# Patient Record
Sex: Male | Born: 1945 | Race: Black or African American | Hispanic: No | Marital: Single | Smoking: Former smoker
Health system: Southern US, Community
[De-identification: ages and names within clinical notes are randomized; demographics above are authoritative.]

## PROBLEM LIST (undated history)

## (undated) DIAGNOSIS — T7840XA Allergy, unspecified, initial encounter: Secondary | ICD-10-CM

## (undated) DIAGNOSIS — N529 Male erectile dysfunction, unspecified: Secondary | ICD-10-CM

## (undated) DIAGNOSIS — I10 Essential (primary) hypertension: Secondary | ICD-10-CM

## (undated) HISTORY — DX: Allergy, unspecified, initial encounter: T78.40XA

## (undated) HISTORY — PX: COLONOSCOPY: SHX174

## (undated) HISTORY — DX: Essential (primary) hypertension: I10

## (undated) HISTORY — DX: Male erectile dysfunction, unspecified: N52.9

---

## 2004-07-19 ENCOUNTER — Ambulatory Visit: Payer: Self-pay | Admitting: Family Medicine

## 2004-11-26 ENCOUNTER — Ambulatory Visit: Payer: Self-pay | Admitting: Family Medicine

## 2005-04-26 ENCOUNTER — Encounter (INDEPENDENT_AMBULATORY_CARE_PROVIDER_SITE_OTHER): Payer: Self-pay | Admitting: *Deleted

## 2005-04-26 LAB — CONVERTED CEMR LAB: PSA: 1.27 ng/mL

## 2005-05-14 ENCOUNTER — Ambulatory Visit: Payer: Self-pay | Admitting: Family Medicine

## 2005-12-05 ENCOUNTER — Encounter (INDEPENDENT_AMBULATORY_CARE_PROVIDER_SITE_OTHER): Payer: Self-pay | Admitting: *Deleted

## 2005-12-05 ENCOUNTER — Ambulatory Visit: Payer: Self-pay | Admitting: Family Medicine

## 2005-12-05 LAB — CONVERTED CEMR LAB: PSA: 1.07 ng/mL

## 2006-04-08 ENCOUNTER — Ambulatory Visit: Payer: Self-pay | Admitting: Family Medicine

## 2006-05-21 ENCOUNTER — Ambulatory Visit (HOSPITAL_COMMUNITY): Admission: RE | Admit: 2006-05-21 | Discharge: 2006-05-21 | Payer: Self-pay | Admitting: General Surgery

## 2006-06-12 ENCOUNTER — Ambulatory Visit: Payer: Self-pay | Admitting: Family Medicine

## 2006-10-13 ENCOUNTER — Ambulatory Visit: Payer: Self-pay | Admitting: Family Medicine

## 2007-01-19 ENCOUNTER — Encounter (INDEPENDENT_AMBULATORY_CARE_PROVIDER_SITE_OTHER): Payer: Self-pay | Admitting: *Deleted

## 2007-01-19 ENCOUNTER — Ambulatory Visit: Payer: Self-pay | Admitting: Family Medicine

## 2007-01-19 LAB — CONVERTED CEMR LAB: PSA: 1.16 ng/mL

## 2007-01-20 ENCOUNTER — Encounter: Payer: Self-pay | Admitting: Family Medicine

## 2007-01-20 LAB — CONVERTED CEMR LAB
Calcium: 8.9 mg/dL (ref 8.4–10.5)
Cholesterol: 152 mg/dL (ref 0–200)
Creatinine, Ser: 0.98 mg/dL (ref 0.40–1.50)
Eosinophils Absolute: 0.2 10*3/uL (ref 0.0–0.7)
Eosinophils Relative: 4 % (ref 0–5)
Glucose, Bld: 87 mg/dL (ref 70–99)
HCT: 39.6 % (ref 39.0–52.0)
Lymphs Abs: 1.3 10*3/uL (ref 0.7–3.3)
MCV: 93.8 fL (ref 78.0–100.0)
Monocytes Absolute: 0.5 10*3/uL (ref 0.2–0.7)
Platelets: 180 10*3/uL (ref 150–400)
RDW: 13.2 % (ref 11.5–14.0)
Sodium: 135 meq/L (ref 135–145)
Total CHOL/HDL Ratio: 2.6
WBC: 5.6 10*3/uL (ref 4.0–10.5)

## 2007-06-03 ENCOUNTER — Ambulatory Visit: Payer: Self-pay | Admitting: Family Medicine

## 2007-07-20 ENCOUNTER — Encounter (INDEPENDENT_AMBULATORY_CARE_PROVIDER_SITE_OTHER): Payer: Self-pay | Admitting: *Deleted

## 2007-11-18 ENCOUNTER — Ambulatory Visit: Payer: Self-pay | Admitting: Family Medicine

## 2007-11-18 DIAGNOSIS — F528 Other sexual dysfunction not due to a substance or known physiological condition: Secondary | ICD-10-CM | POA: Insufficient documentation

## 2007-11-18 DIAGNOSIS — I10 Essential (primary) hypertension: Secondary | ICD-10-CM | POA: Insufficient documentation

## 2007-11-18 DIAGNOSIS — J309 Allergic rhinitis, unspecified: Secondary | ICD-10-CM | POA: Insufficient documentation

## 2008-01-20 ENCOUNTER — Encounter: Payer: Self-pay | Admitting: Family Medicine

## 2008-01-20 LAB — CONVERTED CEMR LAB
BUN: 19 mg/dL (ref 6–23)
Chloride: 104 meq/L (ref 96–112)
Glucose, Bld: 98 mg/dL (ref 70–99)
LDL Cholesterol: 107 mg/dL — ABNORMAL HIGH (ref 0–99)
Lymphocytes Relative: 31 % (ref 12–46)
Lymphs Abs: 1.8 10*3/uL (ref 0.7–4.0)
MCV: 91.5 fL (ref 78.0–100.0)
Monocytes Relative: 7 % (ref 3–12)
Neutro Abs: 3.4 10*3/uL (ref 1.7–7.7)
Neutrophils Relative %: 58 % (ref 43–77)
Potassium: 4.6 meq/L (ref 3.5–5.3)
RBC: 4.35 M/uL (ref 4.22–5.81)
Sodium: 140 meq/L (ref 135–145)
Total CHOL/HDL Ratio: 3.2
VLDL: 16 mg/dL (ref 0–40)
WBC: 5.8 10*3/uL (ref 4.0–10.5)

## 2008-05-10 ENCOUNTER — Ambulatory Visit: Payer: Self-pay | Admitting: Family Medicine

## 2008-10-26 ENCOUNTER — Ambulatory Visit: Payer: Self-pay | Admitting: Family Medicine

## 2008-10-31 DIAGNOSIS — E663 Overweight: Secondary | ICD-10-CM | POA: Insufficient documentation

## 2009-01-03 ENCOUNTER — Telehealth: Payer: Self-pay | Admitting: Family Medicine

## 2009-01-09 ENCOUNTER — Encounter: Payer: Self-pay | Admitting: Family Medicine

## 2009-01-09 LAB — CONVERTED CEMR LAB
Basophils Relative: 0 % (ref 0–1)
CO2: 23 meq/L (ref 19–32)
Calcium: 9.5 mg/dL (ref 8.4–10.5)
Creatinine, Ser: 1.05 mg/dL (ref 0.40–1.50)
Eosinophils Absolute: 0.2 10*3/uL (ref 0.0–0.7)
HDL: 59 mg/dL (ref >39)
Lymphs Abs: 2 10*3/uL (ref 0.7–4.0)
MCHC: 32.8 g/dL (ref 30.0–36.0)
Monocytes Relative: 9 % (ref 3–12)
Neutro Abs: 1.9 10*3/uL (ref 1.7–7.7)
Neutrophils Relative %: 42 % — ABNORMAL LOW (ref 43–77)
Platelets: 167 10*3/uL (ref 150–400)
RBC: 4.22 M/uL (ref 4.22–5.81)
Sodium: 138 meq/L (ref 135–145)
TSH: 1.253 microintl units/mL (ref 0.350–4.500)
Total CHOL/HDL Ratio: 2.9
Triglycerides: 74 mg/dL (ref <150)
WBC: 4.5 10*3/uL (ref 4.0–10.5)

## 2009-02-07 ENCOUNTER — Ambulatory Visit: Payer: Self-pay | Admitting: Family Medicine

## 2009-06-19 ENCOUNTER — Ambulatory Visit: Payer: Self-pay | Admitting: Family Medicine

## 2009-07-03 ENCOUNTER — Ambulatory Visit: Payer: Self-pay | Admitting: Family Medicine

## 2010-01-23 ENCOUNTER — Ambulatory Visit: Payer: Self-pay | Admitting: Family Medicine

## 2010-01-23 DIAGNOSIS — R5381 Other malaise: Secondary | ICD-10-CM

## 2010-01-23 DIAGNOSIS — R5383 Other fatigue: Secondary | ICD-10-CM

## 2010-01-24 LAB — CONVERTED CEMR LAB
BUN: 12 mg/dL (ref 6–23)
Basophils Relative: 0 % (ref 0–1)
CO2: 26 meq/L (ref 19–32)
Calcium: 9.6 mg/dL (ref 8.4–10.5)
Chloride: 100 meq/L (ref 96–112)
Creatinine, Ser: 1.13 mg/dL (ref 0.40–1.50)
Eosinophils Absolute: 0.2 10*3/uL (ref 0.0–0.7)
Eosinophils Relative: 5 % (ref 0–5)
Glucose, Bld: 92 mg/dL (ref 70–99)
HCT: 40.5 % (ref 39.0–52.0)
HDL: 53 mg/dL (ref >39)
Hemoglobin: 13.1 g/dL (ref 13.0–17.0)
LDL Cholesterol: 89 mg/dL (ref 0–99)
Lymphs Abs: 1.7 10*3/uL (ref 0.7–4.0)
MCHC: 32.3 g/dL (ref 30.0–36.0)
MCV: 91.4 fL (ref 78.0–100.0)
Monocytes Absolute: 0.4 10*3/uL (ref 0.1–1.0)
Monocytes Relative: 8 % (ref 3–12)
PSA: 1.21 ng/mL (ref 0.10–4.00)
RBC: 4.43 M/uL (ref 4.22–5.81)
TSH: 1.177 microintl units/mL (ref 0.350–4.500)
WBC: 4.6 10*3/uL (ref 4.0–10.5)

## 2010-06-26 ENCOUNTER — Ambulatory Visit: Payer: Self-pay | Admitting: Family Medicine

## 2010-08-23 ENCOUNTER — Encounter: Payer: Self-pay | Admitting: Family Medicine

## 2010-08-27 ENCOUNTER — Encounter: Payer: Self-pay | Admitting: Family Medicine

## 2010-09-04 NOTE — Assessment & Plan Note (Signed)
Summary: OV   Vital Signs:  Patient profile:   65 year old male Height:      67 inches Weight:      167 pounds BMI:     26.25 O2 Sat:      97 % Pulse rate:   89 / minute Pulse rhythm:   regular Resp:     16 per minute BP sitting:   120 / 60  (left arm) Cuff size:   regular  Vitals Entered By: Everitt Amber LPN (January 23, 2010 10:00 AM)  Nutrition Counseling: Patient's BMI is greater than 25 and therefore counseled on weight management options. CC: Follow up chronic problems   CC:  Follow up chronic problems.  History of Present Illness: Reports  that he has been doing well. He has changed his diet and also remains active, with excellent weight loss. Denies recent fever or chills. Denies sinus pressure, nasal congestion , ear pain or sore throat. Denies chest congestion, or cough productive of sputum. Denies chest pain, palpitations, PND, orthopnea or leg swelling. Denies abdominal pain, nausea, vomitting, diarrhea or constipation. Denies change in bowel movements or bloody stool. Denies dysuria , frequency, incontinence or hesitancy. Denies  joint pain, swelling, or reduced mobility. Denies headaches, vertigo, seizures. Denies depression, anxiety or insomnia. Denies  rash, lesions, or itch.     Current Medications (verified): 1)  Doxazosin Mesylate 4 Mg  Tabs (Doxazosin Mesylate) .... One Tab By Mouth Once Daily 2)  Lisinopril 5 Mg  Tabs (Lisinopril) .... One Tab By Mouth Once Daily 3)  Aspirin 81 Mg  Tbec (Aspirin) .... One Tab By Mouth Once Daily  Allergies (verified): No Known Drug Allergies  Review of Systems      See HPI General:  Complains of fatigue. Eyes:  Denies discharge, eye pain, and red eye. Heme:  Denies abnormal bruising and bleeding. Allergy:  Denies hives or rash and itching eyes.  Physical Exam  General:  Well-developed,well-nourished,in no acute distress; alert,appropriate and cooperative throughout examination HEENT: No facial asymmetry,    EOMI, No sinus tenderness, TM's Clear, oropharynx  pink and moist.   Chest: Clear to auscultation bilaterally.  CVS: S1, S2, No murmurs, No S3.   Abd: Soft, Nontender.  MS: Adequate ROM spine, hips, shoulders and knees.  Ext: No edema.   CNS: CN 2-12 intact, power tone and sensation normal throughout.   Skin: Intact, no visible lesions or rashes.  Psych: Good eye contact, normal affect.  Memory intact, not anxious or depressed appearing.    Impression & Recommendations:  Problem # 1:  FATIGUE (ICD-780.79) Assessment Comment Only  Orders: T-CBC w/Diff ([id number]) T-TSH ([id number])  Problem # 2:  OVERWEIGHT (ICD-278.02) Assessment: Improved  Ht: 67 (01/23/2010)   Wt: 167 (01/23/2010)   BMI: 26.25 (01/23/2010)  Problem # 3:  HYPERTENSION (ICD-401.9) Assessment: Unchanged  His updated medication list for this problem includes:    Doxazosin Mesylate 4 Mg Tabs (Doxazosin mesylate) ..... One tab by mouth once daily    Lisinopril 5 Mg Tabs (Lisinopril) ..... One tab by mouth once daily  BP today: 120/60 Prior BP: 120/80 (07/03/2009)  Labs Reviewed: K+: 5.0 (01/09/2009) Creat: : 1.05 (01/09/2009)   Chol: 171 (01/09/2009)   HDL: 59 (01/09/2009)   LDL: 97 (01/09/2009)   TG: 74 (01/09/2009)  Complete Medication List: 1)  Doxazosin Mesylate 4 Mg Tabs (Doxazosin mesylate) .... One tab by mouth once daily 2)  Lisinopril 5 Mg Tabs (Lisinopril) .... One tab by mouth once daily 3)  Aspirin 81 Mg Tbec (Aspirin) .... One tab by mouth once daily  Other Orders: T-Basic Metabolic Panel [id number]) T-Lipid Profile [phone]) T-PSA [phone])  Patient Instructions: 1)  Please schedule a follow-up appointment in 4 months. 2)  It is important that you exercise regularly at least 20 minutes 5 times a week. If you develop chest pain, have severe difficulty breathing, or feel very tired , stop exercising immediately and seek medical attention. 3)  You need to lose weight.  Consider a lower calorie diet and regular exercise. Congrats on weight loss 9 pounds, keep it up. 4)  Pls continue to eat alot of vegetables and fruits 5)  BMP prior to visit, ICD-9: 6)  Lipid Panel prior to visit, ICD-9: 7)  TSH prior to visit, ICD-9:   fasting today 8)  CBC w/ Diff prior to visit, ICD-9: 9)  PSA prior to visit, ICD-9: 10)  No med changes Prescriptions: LISINOPRIL 5 MG  TABS (LISINOPRIL) one tab by mouth once daily  #90 x 3   Entered by:   Everitt Amber LPN   Authorized by:   Syliva Overman MD   Signed by:   Everitt Amber LPN on 24/40/1027   Method used:   Electronically to        [facility]  [address] (retail)       [address]       [place], [place]  [address]       Ph: [phone]       Fax: [phone]   RxID:   [id number] DOXAZOSIN MESYLATE 4 MG  TABS (DOXAZOSIN MESYLATE) one tab by mouth once daily  #90 x 3   Entered by:   Everitt Amber LPN   Authorized by:   Syliva Overman MD   Signed by:   Everitt Amber LPN on 08/13/3233   Method used:   Electronically to        [facility]  [address] (retail)       [address]       [place], [place]  [address]       Ph: [phone]       Fax: [phone]   RxID:   [id number]

## 2010-09-04 NOTE — Assessment & Plan Note (Signed)
Summary: f up   Vital Signs:  Patient profile:   65 year old male Height:      67 inches Weight:      180.25 pounds BMI:     28.33 O2 Sat:      85 % on Room air Pulse rate:   85 / minute Pulse rhythm:   regular Resp:     16 per minute BP sitting:   104 / 60  (left arm)  Vitals Entered By: Adella Hare LPN (June 26, 2010 11:11 AM)  Nutrition Counseling: Patient's BMI is greater than 25 and therefore counseled on weight management options.  O2 Flow:  Room air CC: follow-up visit Is Patient Diabetic? No Pain Assessment Patient in pain? no        CC:  follow-up visit.  History of Present Illness: Reports  thathe has been  doing well. Denies recent fever or chills. Denies sinus pressure, nasal congestion , ear pain or sore throat. Denies chest congestion, or cough productive of sputum. Denies chest pain, palpitations, PND, orthopnea or leg swelling. Denies abdominal pain, nausea, vomitting, diarrhea or constipation. Denies change in bowel movements or bloody stool. Denies dysuria , frequency, incontinence or hesitancy. Denies  joint pain, swelling, or reduced mobility. Denies headaches, vertigo, seizures. Denies depression, anxiety or insomnia. Denies  rash, lesions, or itch.     Current Medications (verified): 1)  Doxazosin Mesylate 4 Mg  Tabs (Doxazosin Mesylate) .... One Tab By Mouth Once Daily 2)  Lisinopril 5 Mg  Tabs (Lisinopril) .... One Tab By Mouth Once Daily 3)  Aspirin 81 Mg  Tbec (Aspirin) .... One Tab By Mouth Once Daily  Allergies (verified): No Known Drug Allergies  Review of Systems      See HPI Eyes:  Denies discharge, eye pain, and red eye. Endo:  Denies cold intolerance, excessive hunger, excessive thirst, and heat intolerance. Heme:  Denies abnormal bruising, bleeding, and enlarge lymph nodes. Allergy:  Denies hives or rash and itching eyes.  Physical Exam  General:  Well-developed,well-nourished,in no acute distress;  alert,appropriate and cooperative throughout examination HEENT: No facial asymmetry,  EOMI, No sinus tenderness, TM's Clear, oropharynx  pink and moist.   Chest: Clear to auscultation bilaterally.  CVS: S1, S2, No murmurs, No S3.   Abd: Soft, Nontender.  MS: Adequate ROM spine, hips, shoulders and knees.  Ext: No edema.   CNS: CN 2-12 intact, power tone and sensation normal throughout.   Skin: Intact, no visible lesions or rashes.  Psych: Good eye contact, normal affect.  Memory intact, not anxious or depressed appearing.    Impression & Recommendations:  Problem # 1:  HYPERTENSION (ICD-401.9) Assessment Unchanged  His updated medication list for this problem includes:    Doxazosin Mesylate 4 Mg Tabs (Doxazosin mesylate) ..... One tab by mouth once daily    Lisinopril 5 Mg Tabs (Lisinopril) ..... One tab by mouth once daily  Orders: Medicare Electronic Prescription [phone])  BP today: 104/60 Prior BP: 120/60 (01/23/2010)  Labs Reviewed: K+: 4.8 (01/23/2010) Creat: : 1.13 (01/23/2010)   Chol: 157 (01/23/2010)   HDL: 53 (01/23/2010)   LDL: 89 (01/23/2010)   TG: 77 (01/23/2010)  Problem # 2:  ALLERGIC RHINITIS (ICD-477.9) Assessment: Unchanged  Problem # 3:  SPECIAL SCREENING FOR MALIGNANT NEOPLASMS COLON (ICD-V76.51) Assessment: Comment Only  Complete Medication List: 1)  Doxazosin Mesylate 4 Mg Tabs (Doxazosin mesylate) .... One tab by mouth once daily 2)  Lisinopril 5 Mg Tabs (Lisinopril) .... One tab by mouth  once daily 3)  Aspirin 81 Mg Tbec (Aspirin) .... One tab by mouth once daily  Other Orders: Hemoccult Guaiac-1 spec.(in office) (82270) Influenza Vaccine NON MCR ([address]109)  Patient Instructions: 1)  Follow up appointment in 5.64months 2)  No med changes. 3)  It is important that you exercise regularly at least 20 minutes 5 times a week. If you develop chest pain, have severe difficulty breathing, or feel very tired , stop exercising immediately and seek medical  attention. 4)  You need to lose weight. Consider a lower calorie diet and regular exercise.  5)  Keep well 6)  Rectal exam today Prescriptions: LISINOPRIL 5 MG  TABS (LISINOPRIL) one tab by mouth once daily  #90 x 1   Entered by:   Adella Hare LPN   Authorized by:   Syliva Overman MD   Signed by:   Adella Hare LPN on 60/45/4098   Method used:   Electronically to        [facility]  [address] (retail)       [address]       [place], [place]  [address]       Ph: [phone]       Fax: [phone]   RxID:   [id number] DOXAZOSIN MESYLATE 4 MG  TABS (DOXAZOSIN MESYLATE) one tab by mouth once daily  #90 x 1   Entered by:   Adella Hare LPN   Authorized by:   Syliva Overman MD   Signed by:   Adella Hare LPN on 53/66/4403   Method used:   Electronically to        [facility]  [address] (retail)       [address]       [place], [place]  [address]       Ph: [phone]       Fax: [phone]   RxID:   [id number]    Orders Added: 1)  Est. Patient Level IV [73220] 2)  Medicare Electronic Prescription [G8553] 3)  Hemoccult Guaiac-1 spec.(in office) [82270] 4)  Influenza Vaccine NON MCR [00028]   Immunizations Administered:  Influenza Vaccine # 1:    Vaccine Type: Fluvax Non-MCR    Site: right deltoid    Mfr: novartis    Dose: 0.5 ml    Route: IM    Given by: Adella Hare LPN    Exp. Date: 12/2010    Lot #: [id number]    VIS given: 02/27/10 version given June 26, 2010.   Immunizations Administered:  Influenza Vaccine # 1:    Vaccine Type: Fluvax Non-MCR    Site: right deltoid    Mfr: novartis    Dose: 0.5 ml    Route: IM    Given by: Adella Hare LPN    Exp. Date: 12/2010    Lot #: [id number]    VIS given: 02/27/10 version given June 26, 2010.  Laboratory Results  Date/Time Received: June 26, 2010 11:40 AM  Date/Time Reported: June 26, 2010 11:40 AM   Stool - Occult Blood Hemmoccult #1:  negative Date: 06/26/2010 Comments: 51301 13L 10/13 118 10/12 [facility] LPN  June 26, 2010 11:40 AM

## 2010-09-06 NOTE — Letter (Signed)
Summary: certified mail receipt  certified mail receipt   Imported By: Lind Guest 08/27/2010 08:37:29  _____________________________________________________________________  External Attachment:    Type:   Image     Comment:   External Document

## 2010-09-06 NOTE — Letter (Signed)
Summary: Letter to reschedule  Letter to reschedule   Imported By: Lind Guest 08/24/2010 14:08:22  _____________________________________________________________________  External Attachment:    Type:   Image     Comment:   External Document

## 2010-12-17 ENCOUNTER — Encounter: Payer: Self-pay | Admitting: Family Medicine

## 2010-12-21 ENCOUNTER — Ambulatory Visit: Payer: Self-pay | Admitting: Family Medicine

## 2010-12-21 NOTE — H&P (Signed)
NAMEMarland Kitchen  Bill Ward, Bill Ward NO.:  [id number]   MEDICAL RECORD NO.:  [id number]           PATIENT TYPE:   LOCATION:                                 FACILITY:   PHYSICIAN:  Dalia Heading, M.D.  DATE OF BIRTH:  04-13-1946   DATE OF ADMISSION:  05/21/2006  DATE OF DISCHARGE:  LH                                HISTORY & PHYSICAL   CHIEF COMPLAINT:  Anemia, hematochezia.   HISTORY OF PRESENT ILLNESS:  The patient is a 65 year old black male who was  referred for endoscopic evaluation.  He needs a colonoscopy for anemia  hematochezia.  Both were noted on a recent physical examination by his  primary care physician.  No abdominal pain, weight loss, nausea, vomiting,  diarrhea, constipation, or melena have been noted.  He last had a  colonoscopy by Dr. Katrinka Blazing in 2000.  No family history of colon is carcinoma  is noted.   PAST MEDICAL HISTORY:  Hypertension.   PAST SURGICAL HISTORY:  Unremarkable.   CURRENT MEDICATIONS:  A blood pressure pill.   ALLERGIES:  No known drug allergies.   REVIEW OF SYSTEMS:  Noncontributory.   PHYSICAL EXAMINATION:  GENERAL:  The patient is a well-developed, well-  nourished black male in no acute distress.  LUNGS:  Clear to auscultation with equal breath sounds bilaterally.  CARDIAC:  Heart examination reveals a regular rate and rhythm without S3,  S4, or murmurs.  ABDOMEN:  Soft, nontender, nondistended.  No hepatosplenomegaly or masses  noted.  RECTAL:  Deferred to the procedure.   IMPRESSION:  1. Anemia.  2. Hematochezia.   PLAN:  The patient is scheduled for colonoscopy on May 21, 2006.  The  risks and benefits of the procedure, including bleeding and perforation,  were fully explained to the patient, gave informed consent.      Dalia Heading, M.D.  Electronically Signed     MAJ/MEDQ  D:  05/15/2006  T:  05/16/2006  Job:  [id number]   cc:   [facility]  Fax: [phone]   Milus Mallick. Lodema Hong, M.D.  Fax:  [phone]

## 2010-12-25 ENCOUNTER — Ambulatory Visit: Payer: Self-pay | Admitting: Family Medicine

## 2011-02-27 ENCOUNTER — Ambulatory Visit (INDEPENDENT_AMBULATORY_CARE_PROVIDER_SITE_OTHER): Payer: Medicare Other | Admitting: Family Medicine

## 2011-02-27 ENCOUNTER — Encounter: Payer: Self-pay | Admitting: Family Medicine

## 2011-02-27 VITALS — BP 118/72 | HR 79 | Resp 16 | Ht 67.0 in | Wt 184.4 lb

## 2011-02-27 DIAGNOSIS — I10 Essential (primary) hypertension: Secondary | ICD-10-CM

## 2011-02-27 DIAGNOSIS — F528 Other sexual dysfunction not due to a substance or known physiological condition: Secondary | ICD-10-CM

## 2011-02-27 DIAGNOSIS — E663 Overweight: Secondary | ICD-10-CM

## 2011-02-27 DIAGNOSIS — J309 Allergic rhinitis, unspecified: Secondary | ICD-10-CM

## 2011-02-27 DIAGNOSIS — R5381 Other malaise: Secondary | ICD-10-CM

## 2011-02-27 DIAGNOSIS — Z125 Encounter for screening for malignant neoplasm of prostate: Secondary | ICD-10-CM

## 2011-02-27 DIAGNOSIS — Z13220 Encounter for screening for lipoid disorders: Secondary | ICD-10-CM

## 2011-02-27 MED ORDER — LISINOPRIL 5 MG PO TABS: 5.0000 mg | ORAL_TABLET | Freq: Every day | ORAL | Status: DC

## 2011-02-27 MED ORDER — DOXAZOSIN MESYLATE 4 MG PO TABS: 4.0000 mg | ORAL_TABLET | Freq: Every day | ORAL | Status: DC

## 2011-02-27 NOTE — Patient Instructions (Addendum)
cPE early December.  Flu vaccines will be available in September, you can call in for this  Fasting cBC, chem 7 , lipids, PSA as soon as possible  It is important that you exercise regularly at least 30 minutes 5 times a week. If you develop chest pain, have severe difficulty breathing, or feel very tired, stop exercising immediately and seek medical attention   A healthy diet is rich in fruit, vegetables and whole grains. Poultry fish, nuts and beans are a healthy choice for protein rather then red meat. A low sodium diet and drinking 64 ounces of water daily is generally recommended. Oils and sweet should be limited. Carbohydrates especially for those who are diabetic or overweight, should be limited to 34-45 gram per meal. It is important to eat on a regular schedule, at least 3 times daily. Snacks should be primarily fruits, vegetables or nuts.   Ask at the pharmacy if the shingles vaccine is covered

## 2011-03-02 LAB — BASIC METABOLIC PANEL
BUN: 22 mg/dL (ref 6–23)
CO2: 27 mEq/L (ref 19–32)
Calcium: 9.2 mg/dL (ref 8.4–10.5)
Creat: 1.15 mg/dL (ref 0.50–1.35)
Glucose, Bld: 104 mg/dL — ABNORMAL HIGH (ref 70–99)
Sodium: 137 mEq/L (ref 135–145)

## 2011-03-02 LAB — CBC WITH DIFFERENTIAL/PLATELET
Basophils Relative: 1 % (ref 0–1)
Eosinophils Absolute: 0.2 10*3/uL (ref 0.0–0.7)
Eosinophils Relative: 4 % (ref 0–5)
Hemoglobin: 12.5 g/dL — ABNORMAL LOW (ref 13.0–17.0)
Lymphs Abs: 1.6 10*3/uL (ref 0.7–4.0)
MCH: 30.1 pg (ref 26.0–34.0)
MCHC: 32.1 g/dL (ref 30.0–36.0)
MCV: 94 fL (ref 78.0–100.0)
Monocytes Relative: 8 % (ref 3–12)
Neutrophils Relative %: 53 % (ref 43–77)
RBC: 4.15 MIL/uL — ABNORMAL LOW (ref 4.22–5.81)
RDW: 12.9 % (ref 11.5–15.5)

## 2011-03-02 LAB — LIPID PANEL
Cholesterol: 170 mg/dL (ref 0–200)
Total CHOL/HDL Ratio: 3.3 Ratio

## 2011-03-03 LAB — PSA: PSA: 1.33 ng/mL (ref <=4.00)

## 2011-03-10 NOTE — Assessment & Plan Note (Signed)
Improved, no med desired

## 2011-03-10 NOTE — Assessment & Plan Note (Signed)
No current flare 

## 2011-03-10 NOTE — Assessment & Plan Note (Signed)
Controlled, no change in medication  

## 2011-03-10 NOTE — Assessment & Plan Note (Signed)
Deteriorated. Patient re-educated about  the importance of commitment to a  minimum of 150 minutes of exercise per week. The importance of healthy food choices with portion control discussed. Encouraged to start a food diary, count calories and to consider  joining a support group. Sample diet sheets offered. Goals set by the patient for the next several months.    

## 2011-03-10 NOTE — Progress Notes (Signed)
  Subjective:    Patient ID: Bill Ward, male    DOB: 1946/08/05, 65 y.o.   MRN: [id number]  HPI The PT is here for follow up and re-evaluation of chronic medical conditions, medication management and review of any available recent lab and radiology data.  Preventive health is updated, specifically  Cancer screening and Immunization.   Questions or concerns regarding consultations or procedures which the PT has had in the interim are  addressed. The PT denies any adverse reactions to current medications since the last visit.  There are no new concerns.  There are no specific complaints       Review of Systems Denies recent fever or chills. Denies sinus pressure, nasal congestion, ear pain or sore throat. Denies chest congestion, productive cough or wheezing. Denies chest pains, palpitations and leg swelling Denies abdominal pain, nausea, vomiting,diarrhea or constipation.   Denies dysuria, frequency, hesitancy or incontinence. Denies joint pain, swelling and limitation in mobility. Denies headaches, seizures, numbness, or tingling. Denies depression, anxiety or insomnia. Denies skin break down or rash.        Objective:   Physical Exam  Patient alert and oriented and in no cardiopulmonary distress.  HEENT: No facial asymmetry, EOMI, no sinus tenderness,  oropharynx pink and moist.  Neck supple no adenopathy.  Chest: Clear to auscultation bilaterally.  CVS: S1, S2 no murmurs, no S3.  ABD: Soft non tender. Bowel sounds normal.  Ext: No edema  MS: Adequate ROM spine, shoulders, hips and knees.  Skin: Intact, no ulcerations or rash noted.  Psych: Good eye contact, normal affect. Memory intact not anxious or depressed appearing.  CNS: CN 2-12 intact, power, tone and sensation normal throughout.       Assessment & Plan:

## 2011-07-03 ENCOUNTER — Encounter: Payer: Self-pay | Admitting: Family Medicine

## 2011-07-09 ENCOUNTER — Ambulatory Visit (INDEPENDENT_AMBULATORY_CARE_PROVIDER_SITE_OTHER): Payer: Medicare Other | Admitting: Family Medicine

## 2011-07-09 ENCOUNTER — Encounter: Payer: Self-pay | Admitting: Family Medicine

## 2011-07-09 VITALS — BP 120/72 | HR 88 | Resp 16 | Ht 67.0 in | Wt 180.0 lb

## 2011-07-09 DIAGNOSIS — Z0000 Encounter for general adult medical examination without abnormal findings: Secondary | ICD-10-CM

## 2011-07-09 DIAGNOSIS — I10 Essential (primary) hypertension: Secondary | ICD-10-CM

## 2011-07-09 DIAGNOSIS — Z79899 Other long term (current) drug therapy: Secondary | ICD-10-CM

## 2011-07-09 DIAGNOSIS — Z125 Encounter for screening for malignant neoplasm of prostate: Secondary | ICD-10-CM

## 2011-07-09 DIAGNOSIS — Z23 Encounter for immunization: Secondary | ICD-10-CM

## 2011-07-09 DIAGNOSIS — R5381 Other malaise: Secondary | ICD-10-CM

## 2011-07-09 DIAGNOSIS — R5383 Other fatigue: Secondary | ICD-10-CM

## 2011-07-09 MED ORDER — LISINOPRIL 5 MG PO TABS: 5.0000 mg | ORAL_TABLET | Freq: Every day | ORAL | Status: DC

## 2011-07-09 MED ORDER — DOXAZOSIN MESYLATE 4 MG PO TABS: 4.0000 mg | ORAL_TABLET | Freq: Every day | ORAL | Status: DC

## 2011-07-09 NOTE — Assessment & Plan Note (Signed)
Controlled, no change in medication  

## 2011-07-09 NOTE — Progress Notes (Signed)
  Subjective:    Patient ID: Bill Ward, male    DOB: [birth year], 64 y.o.   MRN: [id number]  HPI    Review of Systems     Objective:   Physical Exam        Assessment & Plan:

## 2011-07-09 NOTE — Patient Instructions (Addendum)
F/u in August.  Fasting chem 7, lipid, cbc and PSA  July 27 or after.  Flu vaccine today  No med changes.  All the best for 2013, call if you need to see me before August please

## 2011-07-14 NOTE — Progress Notes (Signed)
  Subjective:    Patient ID: Bill Ward, male    DOB: [birth year], 65 y.o.   MRN: [id number]  HPI The PT is here for annual exam and re-evaluation of chronic medical conditions, medication management and review of any available recent lab and radiology data.  Preventive health is updated, specifically  Cancer screening and Immunization.   Questions or concerns regarding consultations or procedures which the PT has had in the interim are  addressed. The PT denies any adverse reactions to current medications since the last visit.  There are no new concerns.  There are no specific complaints       Review of Systems See HPI Denies recent fever or chills. Denies sinus pressure, nasal congestion, ear pain or sore throat. Denies chest congestion, productive cough or wheezing. Denies chest pains, palpitations and leg swelling Denies abdominal pain, nausea, vomiting,diarrhea or constipation.   Denies dysuria, frequency, hesitancy or incontinence. Denies joint pain, swelling and limitation in mobility. Denies headaches, seizures, numbness, or tingling. Denies depression, anxiety or insomnia. Denies skin break down or rash.        Objective:   Physical Exam Pleasant well nourished male, alert and oriented x 3, in no cardio-pulmonary distress. Afebrile. HEENT No facial trauma or asymetry. Sinuses non tender. EOMI, PERTL, fundoscopic exam is negative for hemorhages or exudates. External ears normal, tympanic membranes clear. Oropharynx moist, no exudate, poor  dentition. Neck: supple, no adenopathy,JVD or thyromegaly.No bruits.  Chest: Clear to ascultation bilaterally.No crackles or wheezes. Non tender to palpation  Breast: No asymetry,no masses. No nipple discharge or inversion. No axillary or supraclavicular adenopathy  Cardiovascular system; Heart sounds normal,  S1 and  S2 ,no S3.  No murmur, or thrill. Apical beat not displaced Peripheral pulses  normal.  Abdomen: Soft, non tender, no organomegaly or masses. No bruits. Bowel sounds normal. No guarding, tenderness or rebound.  Rectal:  No mass. guaiac negative stool. Prostate smooth and firm  GU: Not examined  Musculoskeletal exam: Full ROM of spine, hips , shoulders and knees. No deformity ,swelling or crepitus noted. No muscle wasting or atrophy.   Neurologic: Cranial nerves 2 to 12 intact. Power, tone ,sensation and reflexes normal throughout. No disturbance in gait. No tremor.  Skin: Intact, no ulceration, erythema , scaling or rash noted. Pigmentation normal throughout  Psych; Normal mood and affect. Judgement and concentration normal         Assessment & Plan:

## 2012-02-29 LAB — BASIC METABOLIC PANEL
CO2: 27 mEq/L (ref 19–32)
Calcium: 9.4 mg/dL (ref 8.4–10.5)
Chloride: 101 mEq/L (ref 96–112)
Glucose, Bld: 95 mg/dL (ref 70–99)
Potassium: 4.9 mEq/L (ref 3.5–5.3)
Sodium: 135 mEq/L (ref 135–145)

## 2012-02-29 LAB — LIPID PANEL
HDL: 47 mg/dL (ref >39)
Total CHOL/HDL Ratio: 3.1 Ratio
VLDL: 12 mg/dL (ref 0–40)

## 2012-02-29 LAB — CBC WITH DIFFERENTIAL/PLATELET
Basophils Absolute: 0 10*3/uL (ref 0.0–0.1)
Eosinophils Relative: 2 % (ref 0–5)
Lymphocytes Relative: 32 % (ref 12–46)
MCV: 89 fL (ref 78.0–100.0)
Neutro Abs: 3.2 10*3/uL (ref 1.7–7.7)
Neutrophils Relative %: 57 % (ref 43–77)
Platelets: 220 10*3/uL (ref 150–400)
RDW: 12.4 % (ref 11.5–15.5)
WBC: 5.7 10*3/uL (ref 4.0–10.5)

## 2012-03-01 LAB — PSA, MEDICARE: PSA: 1.7 ng/mL (ref <=4.00)

## 2012-03-10 ENCOUNTER — Ambulatory Visit (INDEPENDENT_AMBULATORY_CARE_PROVIDER_SITE_OTHER): Payer: Medicare Other | Admitting: Family Medicine

## 2012-03-10 ENCOUNTER — Encounter: Payer: Self-pay | Admitting: Family Medicine

## 2012-03-10 VITALS — BP 124/68 | HR 71 | Resp 18 | Ht 67.0 in | Wt 179.1 lb

## 2012-03-10 DIAGNOSIS — Z0000 Encounter for general adult medical examination without abnormal findings: Secondary | ICD-10-CM

## 2012-03-10 DIAGNOSIS — I10 Essential (primary) hypertension: Secondary | ICD-10-CM

## 2012-03-10 DIAGNOSIS — Z1211 Encounter for screening for malignant neoplasm of colon: Secondary | ICD-10-CM

## 2012-03-10 LAB — POC HEMOCCULT BLD/STL (OFFICE/1-CARD/DIAGNOSTIC): Fecal Occult Blood, POC: NEGATIVE
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
## 2012-03-10 NOTE — Patient Instructions (Addendum)
F/u in 6 month, call if you need me before.  Flu vaccine available in October pls call and come for it  Labs and blood pressure are excellent.  Rectal today

## 2012-03-15 DIAGNOSIS — Z0000 Encounter for general adult medical examination without abnormal findings: Secondary | ICD-10-CM | POA: Insufficient documentation

## 2012-03-15 NOTE — Assessment & Plan Note (Signed)
Controlled, no change in medication  

## 2012-03-15 NOTE — Assessment & Plan Note (Signed)
Annual wellness done at visit and documented. Pt is functional fully, no impairment in vision or hearing, no depression or memory loss, able to care for himself. Advanced directives discussed , he is a full code

## 2012-04-29 ENCOUNTER — Telehealth: Payer: Self-pay | Admitting: Family Medicine

## 2012-04-29 ENCOUNTER — Other Ambulatory Visit: Payer: Self-pay | Admitting: Family Medicine

## 2012-04-29 MED ORDER — DOXAZOSIN MESYLATE 4 MG PO TABS: 4.0000 mg | ORAL_TABLET | Freq: Every day | ORAL | Status: DC

## 2012-04-29 MED ORDER — LISINOPRIL 5 MG PO TABS: 5.0000 mg | ORAL_TABLET | Freq: Every day | ORAL | Status: DC

## 2012-04-29 NOTE — Telephone Encounter (Signed)
meds refilled 

## 2012-05-06 ENCOUNTER — Ambulatory Visit: Payer: Medicare Other

## 2012-05-07 ENCOUNTER — Ambulatory Visit (INDEPENDENT_AMBULATORY_CARE_PROVIDER_SITE_OTHER): Payer: Medicare Other

## 2012-05-07 DIAGNOSIS — Z23 Encounter for immunization: Secondary | ICD-10-CM

## 2012-09-10 ENCOUNTER — Ambulatory Visit (INDEPENDENT_AMBULATORY_CARE_PROVIDER_SITE_OTHER): Payer: Medicare Other | Admitting: Family Medicine

## 2012-09-10 ENCOUNTER — Encounter: Payer: Self-pay | Admitting: Family Medicine

## 2012-09-10 VITALS — BP 136/62 | HR 97 | Resp 18 | Ht 67.0 in | Wt 183.0 lb

## 2012-09-10 DIAGNOSIS — Z125 Encounter for screening for malignant neoplasm of prostate: Secondary | ICD-10-CM

## 2012-09-10 DIAGNOSIS — Z13220 Encounter for screening for lipoid disorders: Secondary | ICD-10-CM

## 2012-09-10 DIAGNOSIS — I10 Essential (primary) hypertension: Secondary | ICD-10-CM

## 2012-09-10 DIAGNOSIS — H547 Unspecified visual loss: Secondary | ICD-10-CM

## 2012-09-10 DIAGNOSIS — J309 Allergic rhinitis, unspecified: Secondary | ICD-10-CM

## 2012-09-10 DIAGNOSIS — E663 Overweight: Secondary | ICD-10-CM

## 2012-09-10 NOTE — Patient Instructions (Addendum)
Annual wellness in August, call if you need me before   Fasting lipid, chem 7, PSa, CBc July 27 or after  Chem 7 today  You are referred for eye exam  It is important that you exercise regularly at least 30 minutes 5 times a week. If you develop chest pain, have severe difficulty breathing, or feel very tired, stop exercising immediately and seek medical attention

## 2012-09-11 LAB — BASIC METABOLIC PANEL
Calcium: 10 mg/dL (ref 8.4–10.5)
Glucose, Bld: 104 mg/dL — ABNORMAL HIGH (ref 70–99)
Sodium: 135 mEq/L (ref 135–145)

## 2012-09-12 NOTE — Assessment & Plan Note (Signed)
Controlled and on no medication 

## 2012-09-12 NOTE — Assessment & Plan Note (Signed)
Controlled, no change in medication DASH diet and commitment to daily physical activity for a minimum of 30 minutes discussed and encouraged, as a part of hypertension management. The importance of attaining a healthy weight is also discussed.  

## 2012-09-12 NOTE — Assessment & Plan Note (Addendum)
Deteriorated. Patient re-educated about  the importance of commitment to a  minimum of 150 minutes of exercise per week. The importance of healthy food choices with portion control discussed. Encouraged to start a food diary, count calories and to consider  joining a support group. Sample diet sheets offered. Goals set by the patient for the next several months.    

## 2012-09-12 NOTE — Progress Notes (Signed)
  Subjective:    Patient ID: Bill Ward, male    DOB: Jun 29, 1946, 67 y.o.   MRN: [id number]  HPI The PT is here for follow up and re-evaluation of chronic medical conditions, medication management and review of any available recent lab and radiology data.  Preventive health is updated, specifically  Cancer screening and Immunization.   Questions or concerns regarding consultations or procedures which the PT has had in the interim are  addressed. The PT denies any adverse reactions to current medications since the last visit.  There are no new concerns.  There are no specific complaints       Review of Systems See HPI Denies recent fever or chills. Denies sinus pressure, nasal congestion, ear pain or sore throat. Denies chest congestion, productive cough or wheezing. Denies chest pains, palpitations and leg swelling Denies abdominal pain, nausea, vomiting,diarrhea or constipation.   Denies dysuria, frequency, hesitancy or incontinence. Denies joint pain, swelling and limitation in mobility. Denies headaches, seizures, numbness, or tingling. Denies depression, anxiety or insomnia. Denies skin break down or rash.        Objective:   Physical Exam  Patient alert and oriented and in no cardiopulmonary distress.  HEENT: No facial asymmetry, EOMI, no sinus tenderness,  oropharynx pink and moist.  Neck supple no adenopathy.  Chest: Clear to auscultation bilaterally.  CVS: S1, S2 no murmurs, no S3.  ABD: Soft non tender. Bowel sounds normal.  Ext: No edema  MS: Adequate ROM spine, shoulders, hips and knees.  Skin: Intact, no ulcerations or rash noted.  Psych: Good eye contact, normal affect. Memory intact not anxious or depressed appearing.  CNS: CN 2-12 intact, power, tone and sensation normal throughout.       Assessment & Plan:

## 2013-01-15 ENCOUNTER — Other Ambulatory Visit: Payer: Self-pay | Admitting: Family Medicine

## 2013-01-26 ENCOUNTER — Other Ambulatory Visit: Payer: Self-pay | Admitting: Family Medicine

## 2013-03-11 ENCOUNTER — Encounter: Payer: Self-pay | Admitting: Family Medicine

## 2013-03-11 ENCOUNTER — Ambulatory Visit (INDEPENDENT_AMBULATORY_CARE_PROVIDER_SITE_OTHER): Payer: Medicare Other | Admitting: Family Medicine

## 2013-03-11 VITALS — BP 124/68 | HR 70 | Resp 16 | Ht 67.0 in | Wt 174.1 lb

## 2013-03-11 DIAGNOSIS — I10 Essential (primary) hypertension: Secondary | ICD-10-CM

## 2013-03-11 DIAGNOSIS — Z79899 Other long term (current) drug therapy: Secondary | ICD-10-CM

## 2013-03-11 DIAGNOSIS — R5383 Other fatigue: Secondary | ICD-10-CM

## 2013-03-11 DIAGNOSIS — E663 Overweight: Secondary | ICD-10-CM

## 2013-03-11 DIAGNOSIS — Z0000 Encounter for general adult medical examination without abnormal findings: Secondary | ICD-10-CM

## 2013-03-11 DIAGNOSIS — R5381 Other malaise: Secondary | ICD-10-CM

## 2013-03-11 DIAGNOSIS — Z1211 Encounter for screening for malignant neoplasm of colon: Secondary | ICD-10-CM

## 2013-03-11 DIAGNOSIS — Z125 Encounter for screening for malignant neoplasm of prostate: Secondary | ICD-10-CM

## 2013-03-11 LAB — CBC WITH DIFFERENTIAL/PLATELET
Basophils Absolute: 0 10*3/uL (ref 0.0–0.1)
Basophils Relative: 1 % (ref 0–1)
Eosinophils Absolute: 0.2 10*3/uL (ref 0.0–0.7)
Eosinophils Relative: 4 % (ref 0–5)
HCT: 38.6 % — ABNORMAL LOW (ref 39.0–52.0)
Lymphocytes Relative: 44 % (ref 12–46)
MCH: 29 pg (ref 26.0–34.0)
MCHC: 32.6 g/dL (ref 30.0–36.0)
MCV: 88.9 fL (ref 78.0–100.0)
Monocytes Absolute: 0.4 10*3/uL (ref 0.1–1.0)
Platelets: 172 10*3/uL (ref 150–400)
RDW: 12.8 % (ref 11.5–15.5)
WBC: 4.1 10*3/uL (ref 4.0–10.5)

## 2013-03-11 LAB — LIPID PANEL
HDL: 57 mg/dL (ref >39)
LDL Cholesterol: 94 mg/dL (ref 0–99)
Triglycerides: 62 mg/dL (ref <150)

## 2013-03-11 LAB — BASIC METABOLIC PANEL
BUN: 22 mg/dL (ref 6–23)
Calcium: 9.4 mg/dL (ref 8.4–10.5)
Chloride: 102 mEq/L (ref 96–112)
Creat: 1.06 mg/dL (ref 0.50–1.35)

## 2013-03-11 LAB — POC HEMOCCULT BLD/STL (OFFICE/1-CARD/DIAGNOSTIC): Fecal Occult Blood, POC: NEGATIVE

## 2013-03-11 NOTE — Progress Notes (Signed)
Subjective:    Patient ID: Bill Ward, male    DOB: [birth year], 66 y.o.   MRN: [id number]  HPI Preventive Screening-Counseling & Management   Patient present here today for a Medicare annual wellness visit.   Current Problems (verified)   Medications Prior to Visit Allergies (verified)   PAST HISTORY  Family History 2 brothers living, 2 deceased, 2 sisters living No heart disease at an early age, one brother died of heart attack in his 49's, no stroke, cancer depression, cancer  Social History Single, never married worked at [employer] for 32 years Stopped nicotine over 30 years, stopped over 40 years, no pot over 30 years  ago   Risk Factors  Current exercise habits:  Regular physical activity  Dietary issues discussed:alot of vegetable and fruit, mainly water   Cardiac risk factors:   Depression Screen  (Note: if answer to either of the following is "Yes", a more complete depression screening is indicated)   Over the past two weeks, have you felt down, depressed or hopeless? No  Over the past two weeks, have you felt little interest or pleasure in doing things? No  Have you lost interest or pleasure in daily life? No  Do you often feel hopeless? No  Do you cry easily over simple problems? No   Activities of Daily Living  In your present state of health, do you have any difficulty performing the following activities?  Driving?: No Managing money?: No Feeding yourself?:No Getting from bed to chair?:No Climbing a flight of stairs?:No Preparing food and eating?:No Bathing or showering?:No Getting dressed?:No Getting to the toilet?:No Using the toilet?:No Moving around from place to place?: No  Fall Risk Assessment In the past year have you fallen or had a near fall?:No Are you currently taking any medications that make you dizzy?:No   Hearing Difficulties: No Do you often ask people to speak up or repeat themselves?:No Do you experience ringing or  noises in your ears?:No Do you have difficulty understanding soft or whispered voices?:No  Cognitive Testing  Alert? Yes Normal Appearance?Yes  Oriented to person? Yes Place? Yes  Time? Yes  Displays appropriate judgment?Yes  Can read the correct time from a watch face? yes Are you having problems remembering things?No  Advanced Directives have been discussed with the patient?Yes , full code   List the Names of Other Physician/Practitioners you currently use: None   Indicate any recent Medical Services you may have received from other than [facility] providers in the past year (date may be approximate).   Assessment:    Annual Wellness Exam   Plan:    During the course of the visit the patient was educated and counseled about appropriate screening and preventive services including:  A healthy diet is rich in fruit, vegetables and whole grains. Poultry fish, nuts and beans are a healthy choice for protein rather then red meat. A low sodium diet and drinking 64 ounces of water daily is generally recommended. Oils and sweet should be limited. Carbohydrates especially for those who are diabetic or overweight, should be limited to 30-45 gram per meal. It is important to eat on a regular schedule, at least 3 times daily. Snacks should be primarily fruits, vegetables or nuts. It is important that you exercise regularly at least 30 minutes 5 times a week. If you develop chest pain, have severe difficulty breathing, or feel very tired, stop exercising immediately and seek medical attention  Immunization reviewed and updated. Cancer screening reviewed and  updated    Patient Instructions (the written plan) was given to the patient.  Medicare Attestation  I have personally reviewed:  The patient's medical and social history  Their use of alcohol, tobacco or illicit drugs  Their current medications and supplements  The patient's functional ability including ADLs,fall risks, home safety risks,  cognitive, and hearing and visual impairment  Diet and physical activities  Evidence for depression or mood disorders  The patient's weight, height, BMI, and visual acuity have been recorded in the chart. I have made referrals, counseling, and provided education to the patient based on review of the above and I have provided the patient with a written personalized care plan for preventive services.      Review of Systems     Objective:   Physical Exam  Rectal exam today for colon cancer screening  No mass, prostate smooth, not enlarged, no nodules palpated. Heme negative stool.      Assessment & Plan:

## 2013-03-11 NOTE — Patient Instructions (Addendum)
F/u in January, call if you need me before  Pls call in October for flu vaccine   PLS check today  For shingles vaccine   CBC, chem 7, PSA, lipid, TSH today  Rectal exam today

## 2013-03-12 LAB — TSH: TSH: 0.709 u[IU]/mL (ref 0.350–4.500)

## 2013-03-12 LAB — PSA, MEDICARE: PSA: 1.48 ng/mL (ref <=4.00)

## 2013-03-14 NOTE — Assessment & Plan Note (Signed)
Annual wellness as documented. Pt fully functional independently with no limitations. Needs eye exam and zostavax. Encouraged to continue commitment to regular physical activity and healthy food choices

## 2013-03-31 ENCOUNTER — Other Ambulatory Visit: Payer: Self-pay

## 2013-03-31 ENCOUNTER — Telehealth: Payer: Self-pay | Admitting: Family Medicine

## 2013-03-31 MED ORDER — LISINOPRIL 5 MG PO TABS: ORAL_TABLET | ORAL | Status: DC

## 2013-03-31 MED ORDER — DOXAZOSIN MESYLATE 4 MG PO TABS: ORAL_TABLET | ORAL | Status: DC

## 2013-03-31 NOTE — Telephone Encounter (Signed)
These were faxed in earlier

## 2013-05-12 ENCOUNTER — Encounter (INDEPENDENT_AMBULATORY_CARE_PROVIDER_SITE_OTHER): Payer: Self-pay

## 2013-05-12 ENCOUNTER — Ambulatory Visit (INDEPENDENT_AMBULATORY_CARE_PROVIDER_SITE_OTHER): Payer: Medicare Other

## 2013-05-12 DIAGNOSIS — Z23 Encounter for immunization: Secondary | ICD-10-CM

## 2013-08-10 ENCOUNTER — Encounter: Payer: Self-pay | Admitting: Family Medicine

## 2013-08-10 ENCOUNTER — Ambulatory Visit (INDEPENDENT_AMBULATORY_CARE_PROVIDER_SITE_OTHER): Payer: Medicare Other | Admitting: Family Medicine

## 2013-08-10 ENCOUNTER — Ambulatory Visit (HOSPITAL_COMMUNITY)
Admission: RE | Admit: 2013-08-10 | Discharge: 2013-08-10 | Disposition: A | Discharge status: Home / Self Care | Payer: Medicare Other | Source: Ambulatory Visit | Attending: Family Medicine | Admitting: Family Medicine

## 2013-08-10 ENCOUNTER — Encounter (INDEPENDENT_AMBULATORY_CARE_PROVIDER_SITE_OTHER): Payer: Self-pay

## 2013-08-10 VITALS — BP 140/70 | HR 99 | Resp 16 | Ht 67.0 in | Wt 177.0 lb

## 2013-08-10 DIAGNOSIS — I10 Essential (primary) hypertension: Secondary | ICD-10-CM

## 2013-08-10 DIAGNOSIS — R0602 Shortness of breath: Secondary | ICD-10-CM

## 2013-08-10 DIAGNOSIS — Z79899 Other long term (current) drug therapy: Secondary | ICD-10-CM

## 2013-08-10 DIAGNOSIS — M47814 Spondylosis without myelopathy or radiculopathy, thoracic region: Secondary | ICD-10-CM | POA: Insufficient documentation

## 2013-08-10 DIAGNOSIS — Z125 Encounter for screening for malignant neoplasm of prostate: Secondary | ICD-10-CM

## 2013-08-10 DIAGNOSIS — R0789 Other chest pain: Secondary | ICD-10-CM

## 2013-08-10 DIAGNOSIS — E663 Overweight: Secondary | ICD-10-CM

## 2013-08-10 NOTE — Patient Instructions (Addendum)
Annual wellness August 10 or after, pls call if you need me before  Exam, today is good and EKG shows no heart damage  CXR today  Fasting CBc, lipid, chem 7 , PSA August 8 or after  Continue healthy habits

## 2013-08-10 NOTE — Progress Notes (Signed)
   Subjective:    Patient ID: Bill Ward, male    DOB: 09-12-1945, 68 y.o.   MRN: [id number]  HPI The PT is here for follow up and re-evaluation of chronic medical conditions, medication management and review of any available recent lab and radiology data.  Preventive health is updated, specifically  Cancer screening and Immunization.   Questions or concerns regarding consultations or procedures which the PT has had in the interim are  addressed.The PT denies any adverse reactions to current medications since the last visit.  Notes in creased shortness of breath with activity in past 2 to 3 month, also intermittent episodes of substernal discomfort, not related specifically to activity, non radiating , no associated nausea or light headedness or sweating      Review of Systems See HPI Denies recent fever or chills. Denies sinus pressure, nasal congestion, ear pain or sore throat. Denies chest congestion, productive cough or wheezing. Denies PND, orthopnea, palpitations and leg swelling Denies abdominal pain, nausea, vomiting,diarrhea or constipation.   Denies dysuria, frequency, hesitancy or incontinence. Denies joint pain, swelling and limitation in mobility. Denies headaches, seizures, numbness, or tingling. Denies depression, anxiety or insomnia. Denies skin break down or rash.        Objective:   Physical Exam  Patient alert and oriented and in no cardiopulmonary distress.  HEENT: No facial asymmetry, EOMI, no sinus tenderness,  oropharynx pink and moist.  Neck supple no adenopathy.  Chest: Clear to auscultation bilaterally.No reproducible chest wall pain  CVS: S1, S2 no murmurs, no S3.  ABD: Soft non tender. Bowel sounds normal.  Ext: No edema  MS: Adequate ROM spine, shoulders, hips and knees.  Skin: Intact, no ulcerations or rash noted.  Psych: Good eye contact, normal affect. Memory intact not anxious or depressed appearing.  CNS: CN 2-12 intact, power,  tone and sensation normal throughout.       Assessment & Plan:

## 2013-08-16 NOTE — Assessment & Plan Note (Signed)
Unchnaged. Patient re-educated about  the importance of commitment to a  minimum of 150 minutes of exercise per week. The importance of healthy food choices with portion control discussed. Encouraged to start a food diary, count calories and to consider  joining a support group. Sample diet sheets offered. Goals set by the patient for the next several months.    

## 2013-08-16 NOTE — Assessment & Plan Note (Signed)
Controlled, no change in medication DASH diet and commitment to daily physical activity for a minimum of 30 minutes discussed and encouraged, as a part of hypertension management. The importance of attaining a healthy weight is also discussed.  

## 2013-08-16 NOTE — Assessment & Plan Note (Signed)
Intermittent chest pain , needs EKg.  NSR with no ischemia

## 2013-08-16 NOTE — Assessment & Plan Note (Signed)
Recent increase in exertional dyspnea, CXr today

## 2013-08-26 ENCOUNTER — Encounter: Payer: Self-pay | Admitting: Family Medicine

## 2013-10-27 ENCOUNTER — Other Ambulatory Visit: Payer: Self-pay | Admitting: Family Medicine

## 2014-03-12 ENCOUNTER — Other Ambulatory Visit: Payer: Self-pay | Admitting: Family Medicine

## 2014-03-12 LAB — CBC WITH DIFFERENTIAL/PLATELET
BASOS ABS: 0 10*3/uL (ref 0.0–0.1)
Basophils Relative: 0 % (ref 0–1)
EOS PCT: 3 % (ref 0–5)
Eosinophils Absolute: 0.2 10*3/uL (ref 0.0–0.7)
HCT: 40.7 % (ref 39.0–52.0)
Hemoglobin: 13.2 g/dL (ref 13.0–17.0)
LYMPHS ABS: 1.8 10*3/uL (ref 0.7–4.0)
Lymphocytes Relative: 35 % (ref 12–46)
MCH: 29.5 pg (ref 26.0–34.0)
MCHC: 32.4 g/dL (ref 30.0–36.0)
MCV: 90.8 fL (ref 78.0–100.0)
Monocytes Absolute: 0.5 10*3/uL (ref 0.1–1.0)
Monocytes Relative: 9 % (ref 3–12)
Neutro Abs: 2.8 10*3/uL (ref 1.7–7.7)
Neutrophils Relative %: 53 % (ref 43–77)
PLATELETS: 201 10*3/uL (ref 150–400)
RBC: 4.48 MIL/uL (ref 4.22–5.81)
RDW: 12.8 % (ref 11.5–15.5)
WBC: 5.2 10*3/uL (ref 4.0–10.5)

## 2014-03-12 LAB — LIPID PANEL
Cholesterol: 171 mg/dL (ref 0–200)
HDL: 55 mg/dL (ref >39)
LDL Cholesterol: 95 mg/dL (ref 0–99)
Total CHOL/HDL Ratio: 3.1 Ratio
Triglycerides: 103 mg/dL (ref <150)
VLDL: 21 mg/dL (ref 0–40)

## 2014-03-12 LAB — BASIC METABOLIC PANEL
BUN: 16 mg/dL (ref 6–23)
CO2: 25 meq/L (ref 19–32)
Calcium: 9.3 mg/dL (ref 8.4–10.5)
Chloride: 103 mEq/L (ref 96–112)
Creat: 1.01 mg/dL (ref 0.50–1.35)
Glucose, Bld: 98 mg/dL (ref 70–99)
Potassium: 5 mEq/L (ref 3.5–5.3)
Sodium: 136 mEq/L (ref 135–145)

## 2014-03-14 LAB — PSA, MEDICARE: PSA: 2.22 ng/mL (ref <=4.00)

## 2014-03-22 ENCOUNTER — Ambulatory Visit (INDEPENDENT_AMBULATORY_CARE_PROVIDER_SITE_OTHER): Payer: Medicare Other | Admitting: Family Medicine

## 2014-03-22 ENCOUNTER — Encounter: Payer: Self-pay | Admitting: Family Medicine

## 2014-03-22 ENCOUNTER — Encounter (INDEPENDENT_AMBULATORY_CARE_PROVIDER_SITE_OTHER): Payer: Self-pay

## 2014-03-22 VITALS — BP 150/78 | HR 65 | Resp 16 | Ht 66.0 in | Wt 180.0 lb

## 2014-03-22 DIAGNOSIS — Z23 Encounter for immunization: Secondary | ICD-10-CM | POA: Insufficient documentation

## 2014-03-22 DIAGNOSIS — N528 Other male erectile dysfunction: Secondary | ICD-10-CM

## 2014-03-22 DIAGNOSIS — Z0000 Encounter for general adult medical examination without abnormal findings: Secondary | ICD-10-CM | POA: Insufficient documentation

## 2014-03-22 DIAGNOSIS — N529 Male erectile dysfunction, unspecified: Secondary | ICD-10-CM | POA: Insufficient documentation

## 2014-03-22 DIAGNOSIS — I10 Essential (primary) hypertension: Secondary | ICD-10-CM

## 2014-03-22 MED ORDER — LISINOPRIL 10 MG PO TABS: 10.0000 mg | ORAL_TABLET | Freq: Every day | ORAL | Status: DC

## 2014-03-22 MED ORDER — TADALAFIL 10 MG PO TABS: ORAL_TABLET | ORAL | Status: DC

## 2014-03-22 NOTE — Progress Notes (Signed)
Subjective:    Patient ID: Bill Ward, male    DOB: 09-01-1945, 68 y.o.   MRN: [id number]  HPI Preventive Screening-Counseling & Management   Patient present here today for a subsequent  Medicare annual wellness visit.   Current Problems (verified)   Medications Prior to Visit Allergies (verified)   PAST HISTORY  Family History (verified)   Social History Never been married, retired. No children    Risk Factors  Current exercise habits: Walks almost everyday, cuts a lot of grass. no exercise routine.   Dietary issues discussed: Heart healthy diet, low carb, eats mainly fruits and vegetables. Rarely eats red meat    Cardiac risk factors:   Depression Screen  (Note: if answer to either of the following is "Yes", a more complete depression screening is indicated)   Over the past two weeks, have you felt down, depressed or hopeless? No  Over the past two weeks, have you felt little interest or pleasure in doing things? No  Have you lost interest or pleasure in daily life? No  Do you often feel hopeless? No  Do you cry easily over simple problems? No   Activities of Daily Living  In your present state of health, do you have any difficulty performing the following activities?  Driving?: Doesn't drive anymore  Managing money?: no  Feeding yourself?:No Getting from bed to chair?:No Climbing a flight of stairs?:No Preparing food and eating?:No Bathing or showering?:No Getting dressed?:No Getting to the toilet?:No Using the toilet?:No Moving around from place to place?: No  Fall Risk Assessment In the past year have you fallen or had a near fall?:No Are you currently taking any medications that make you dizziness?:No   Hearing Difficulties: No Do you often ask people to speak up or repeat themselves?:No Do you experience ringing or noises in your ears?:No Do you have difficulty understanding soft or whispered voices?:No  Cognitive Testing  Alert? Yes Normal  Appearance?Yes  Oriented to person? Yes Place? Yes  Time? Yes  Displays appropriate judgment?Yes  Can read the correct time from a watch face? yes Are you having problems remembering things?No  Advanced Directives have been discussed with the patient? Yes, patient doesn't want a living will but brochure given    List the Names of Other Physician/Practitioners you currently use:  None per pt   Indicate any recent Medical Services you may have received from other than [facility] providers in the past year (date may be approximate).   Assessment:    Annual Wellness Exam   Plan:     Medicare Attestation  I have personally reviewed:  The patient's medical and social history  Their use of alcohol, tobacco or illicit drugs  Their current medications and supplements  The patient's functional ability including ADLs,fall risks, home safety risks, cognitive, and hearing and visual impairment  Diet and physical activities  Evidence for depression or mood disorders  The patient's weight, height, BMI, and visual acuity have been recorded in the chart. I have made referrals, counseling, and provided education to the patient based on review of the above and I have provided the patient with a written personalized care plan for preventive services.      Review of Systems     Objective:   Physical Exam        Assessment & Plan:  HYPERTENSION Under corrcted, increase to 10mg  tab DASH diet and commitment to daily physical activity for a minimum of 30 minutes discussed and encouraged, as a  part of hypertension management. The importance of attaining a healthy weight is also discussed.   Medicare annual wellness visit, subsequent Annual exam as documented. Counseling done  re healthy lifestyle involving commitment to 150 minutes exercise per week, heart healthy diet, and attaining healthy weight.The importance of adequate sleep also discussed. Regular seat belt use and safe storage  of  firearms if patient has them, is also discussed. Changes in health habits are decided on by the patient with goals and time frames  set for achieving them. Immunization and cancer screening needs are specifically addressed at this visit.   Need for vaccination with 13-polyvalent pneumococcal conjugate vaccine Vaccine administered  Erectile dysfunction Difficulty with attaining and maintaining erection, script for cialis provided

## 2014-03-22 NOTE — Assessment & Plan Note (Signed)
Annual exam as documented. Counseling done  re healthy lifestyle involving commitment to 150 minutes exercise per week, heart healthy diet, and attaining healthy weight.The importance of adequate sleep also discussed. Regular seat belt use and safe storage  of firearms if patient has them, is also discussed. Changes in health habits are decided on by the patient with goals and time frames  set for achieving them. Immunization and cancer screening needs are specifically addressed at this visit.  

## 2014-03-22 NOTE — Assessment & Plan Note (Signed)
Vaccine administered.

## 2014-03-22 NOTE — Addendum Note (Signed)
Addended by: Abner GreenspanHUDY, BRANDI H on: 03/22/2014 10:03 AM   Modules accepted: Orders

## 2014-03-22 NOTE — Assessment & Plan Note (Signed)
Under corrcted, increase to 10mg  tab DASH diet and commitment to daily physical activity for a minimum of 30 minutes discussed and encouraged, as a part of hypertension management. The importance of attaining a healthy weight is also discussed.

## 2014-03-22 NOTE — Assessment & Plan Note (Signed)
Difficulty with attaining and maintaining erection, script for cialis provided

## 2014-03-22 NOTE — Patient Instructions (Signed)
F/u 2nd week in October with rectal  Prevnar today  BP is high, increase in zestril to 10mg  daily, oK to take TWO 5mg  tabs daily till done  New for sexual functio is cialis , you may fill 1 to 2  Tabs each time due to cost  Prevnar today

## 2014-04-26 ENCOUNTER — Other Ambulatory Visit: Payer: Self-pay | Admitting: Family Medicine

## 2014-05-23 ENCOUNTER — Ambulatory Visit (INDEPENDENT_AMBULATORY_CARE_PROVIDER_SITE_OTHER): Payer: Medicare Other | Admitting: Family Medicine

## 2014-05-23 ENCOUNTER — Encounter: Payer: Self-pay | Admitting: Family Medicine

## 2014-05-23 VITALS — BP 120/70 | HR 82 | Resp 16 | Ht 66.0 in | Wt 177.1 lb

## 2014-05-23 DIAGNOSIS — Z1211 Encounter for screening for malignant neoplasm of colon: Secondary | ICD-10-CM | POA: Insufficient documentation

## 2014-05-23 DIAGNOSIS — Z79899 Other long term (current) drug therapy: Secondary | ICD-10-CM

## 2014-05-23 DIAGNOSIS — Z23 Encounter for immunization: Secondary | ICD-10-CM

## 2014-05-23 DIAGNOSIS — N528 Other male erectile dysfunction: Secondary | ICD-10-CM

## 2014-05-23 DIAGNOSIS — I10 Essential (primary) hypertension: Secondary | ICD-10-CM

## 2014-05-23 LAB — POC HEMOCCULT BLD/STL (OFFICE/1-CARD/DIAGNOSTIC): Fecal Occult Blood, POC: NEGATIVE

## 2014-05-23 MED ORDER — DOXAZOSIN MESYLATE 4 MG PO TABS: ORAL_TABLET | ORAL | Status: DC

## 2014-05-23 NOTE — Assessment & Plan Note (Signed)
Vaccine administered at visit.  

## 2014-05-23 NOTE — Assessment & Plan Note (Signed)
Unable to afford medication, will hold on alternative med

## 2014-05-23 NOTE — Patient Instructions (Addendum)
F/u in 6 months, call if you need me before  Blood pressure is excellent  Rectal exam and flu vaccine today  Continue  Healthy habits  Flu vaccine today

## 2014-05-23 NOTE — Assessment & Plan Note (Signed)
Exam as documented, hem negative stool

## 2014-05-23 NOTE — Assessment & Plan Note (Signed)
Controlled, no change in medication DASH diet and commitment to daily physical activity for a minimum of 30 minutes discussed and encouraged, as a part of hypertension management. The importance of attaining a healthy weight is also discussed.  

## 2014-05-23 NOTE — Progress Notes (Signed)
   Subjective:    Patient ID: Bill Ward, male    DOB: [birth year], 68 y.o.   MRN: [id number]  HPI The PT is here for follow up and re-evaluation of chronic medical conditions, medication management and review of any available recent lab and radiology data.  Preventive health is updated, specifically  Cancer screening and Immunization.   Questions or concerns regarding consultations or procedures which the PT has had in the interim are  addressed. The PT denies any adverse reactions to current medications since the last visit. Unable to afford cialis, no interest in replacement There are no new concerns.  There are no specific complaints       Review of Systems See HPI Denies recent fever or chills. Denies sinus pressure, nasal congestion, ear pain or sore throat. Denies chest congestion, productive cough or wheezing. Denies chest pains, palpitations and leg swelling Denies abdominal pain, nausea, vomiting,diarrhea or constipation.   Denies dysuria, frequency, hesitancy or incontinence. Denies joint pain, swelling and limitation in mobility. Denies headaches, seizures, numbness, or tingling. Denies depression, anxiety or insomnia. Denies skin break down or rash.        Objective:   Physical Exam BP 120/70  Pulse 82  Resp 16  Ht 5\' 6"  (1.676 m)  Wt 177 lb 1.9 oz (80.341 kg)  BMI 28.60 kg/m2  SpO2 97% Patient alert and oriented and in no cardiopulmonary distress.  HEENT: No facial asymmetry, EOMI,   oropharynx pink and moist.  Neck supple no JVD, no mass.  Chest: Clear to auscultation bilaterally.  CVS: S1, S2 no murmurs, no S3.Regular rate.  ABD: Soft non tender. No organomegaly i or mass Rectal: good sphincter tone, prostate smooth, not enlarged, heme negative stool  Ext: No edema  MS: Adequate ROM spine, shoulders, hips and knees.  Skin: Intact, no ulcerations or rash noted.  Psych: Good eye contact, normal affect. Memory intact not anxious or depressed  appearing.  CNS: CN 2-12 intact, power,  normal throughout.no focal deficits noted.        Assessment & Plan:  Essential hypertension Controlled, no change in medication DASH diet and commitment to daily physical activity for a minimum of 30 minutes discussed and encouraged, as a part of hypertension management. The importance of attaining a healthy weight is also discussed.   Erectile dysfunction Unable to afford medication, will hold on alternative med  Need for prophylactic vaccination and inoculation against influenza Vaccine administered at visit.   Special screening for malignant neoplasms, colon Exam as documented, hem negative stool

## 2014-05-31 ENCOUNTER — Telehealth: Payer: Self-pay | Admitting: Family Medicine

## 2014-05-31 NOTE — Telephone Encounter (Signed)
Patient is asking for an order for viagra.  Please advise.

## 2014-06-01 IMAGING — CR DG CHEST 2V
2 series · 2 of 2 positions shown · non-contrast
Comparison: None.

CLINICAL DATA: Hypertension. Shortness of breath.

EXAM:
CHEST  2 VIEW

[view not recorded (1 of 2)]
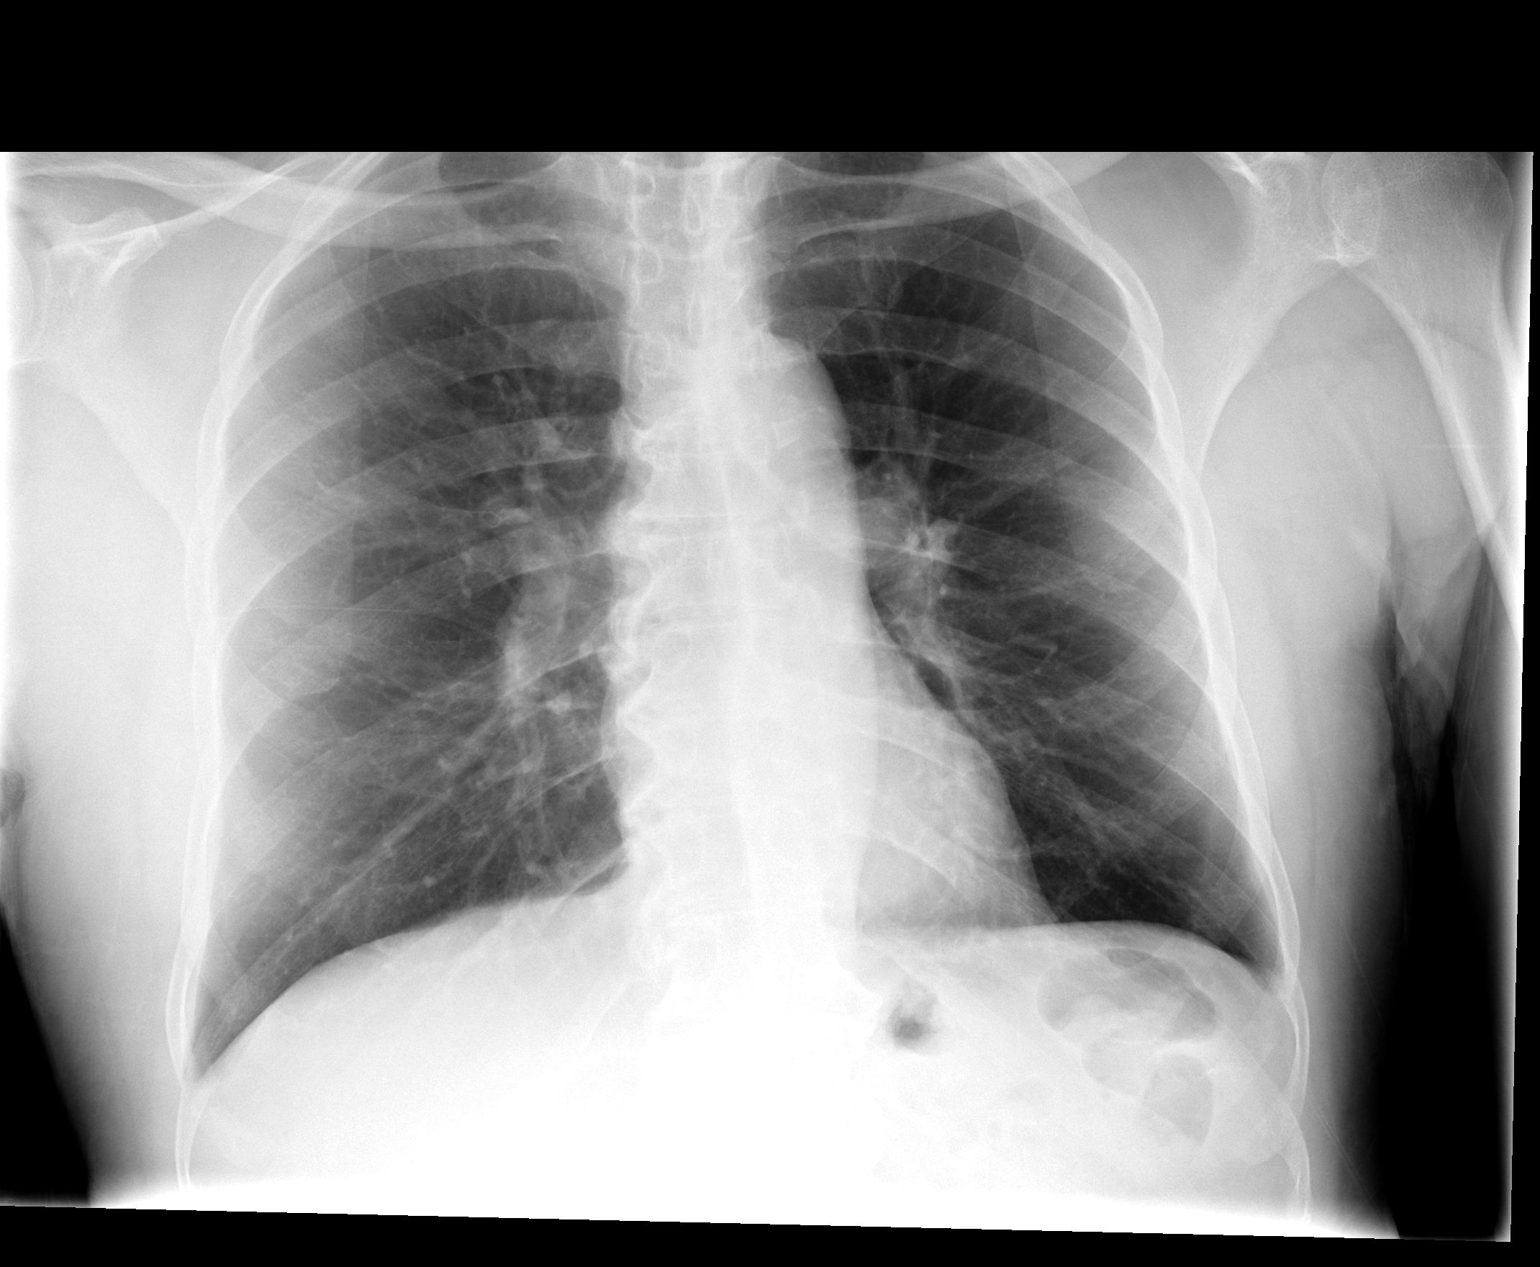

[view not recorded (2 of 2)]
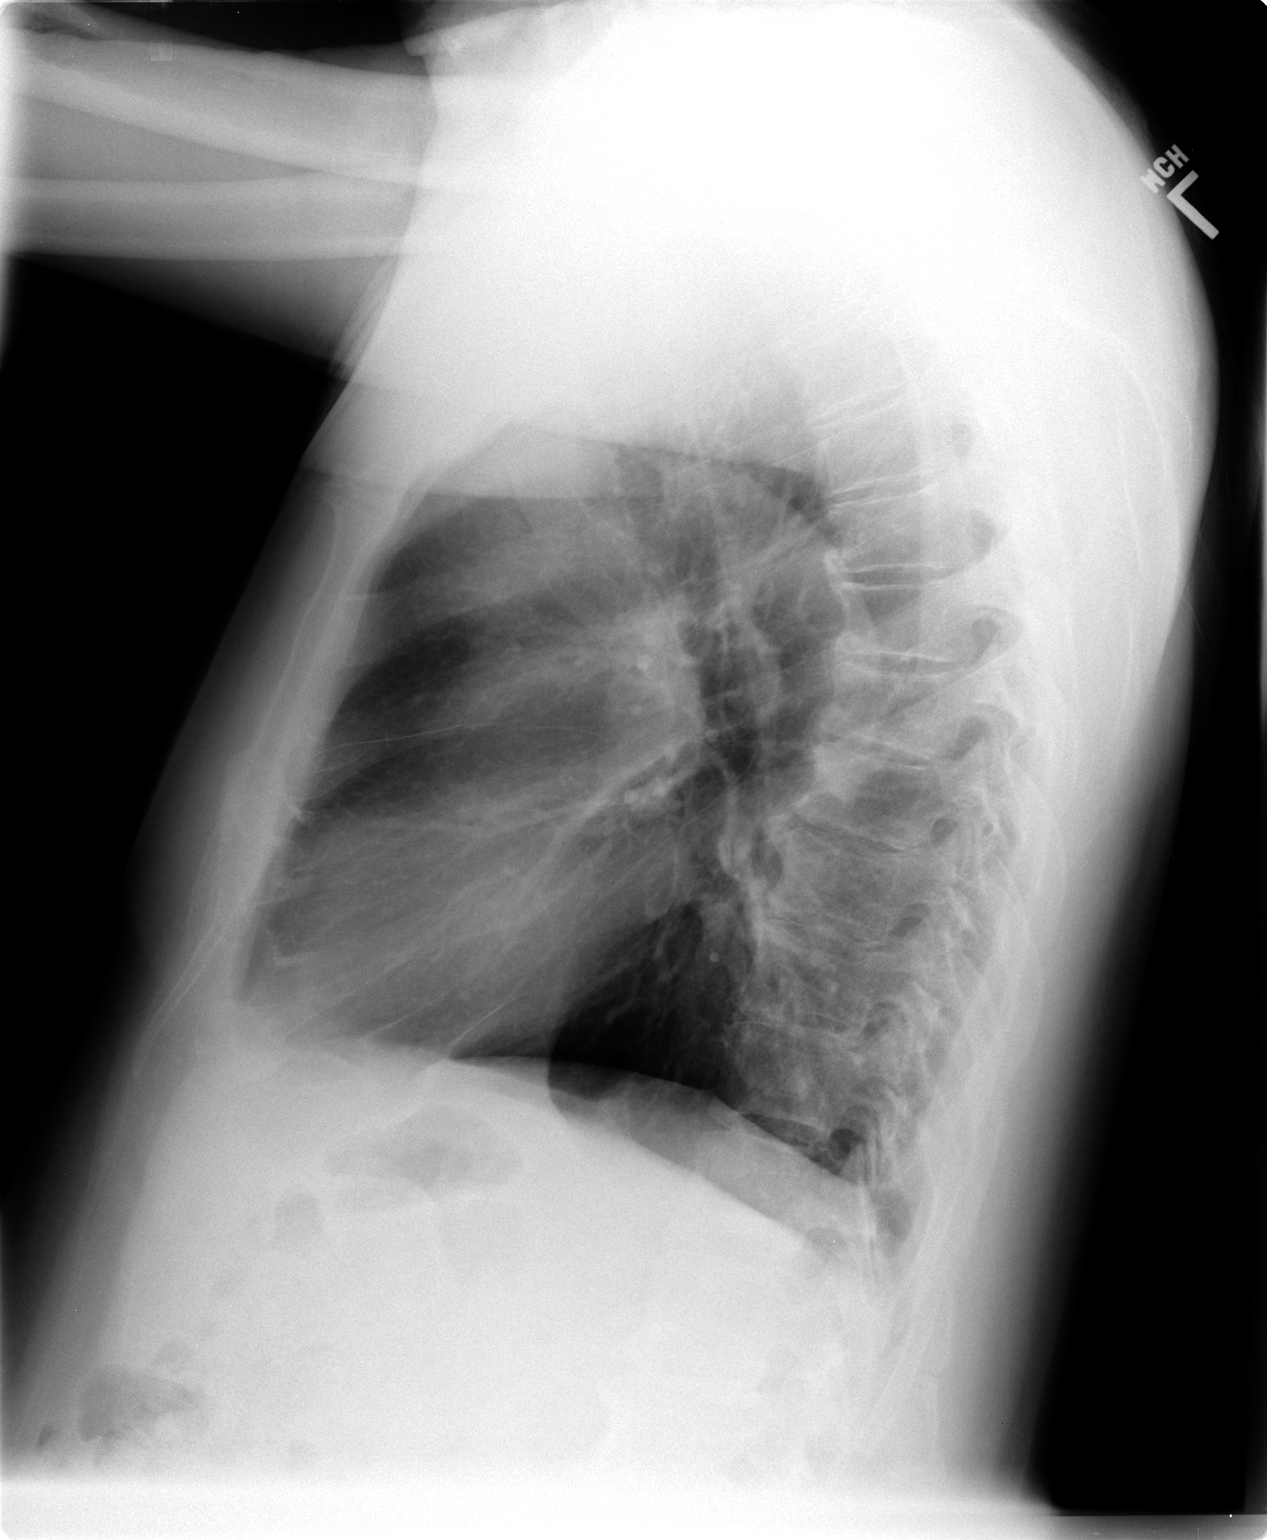

[2 of 2 positions shown; findings below may reference images not displayed]

FINDINGS: Thoracic spondylosis noted. Mild tortuosity of the thoracic aorta.
Heart size within normal limits.

The lungs appear clear.
IMPRESSION: 1. Mild thoracic spondylosis.  No acute findings.
2. Mild tortuosity of the thoracic aorta.

## 2014-06-02 NOTE — Telephone Encounter (Signed)
I will sign one of their order forms with thew dose later  Today , pls put on my desk with his name etc, then we fax today and let him know, thanks

## 2014-06-07 NOTE — Telephone Encounter (Signed)
Patient aware and has collected rx.

## 2014-08-31 ENCOUNTER — Other Ambulatory Visit: Payer: Self-pay | Admitting: Family Medicine

## 2014-11-22 ENCOUNTER — Ambulatory Visit (INDEPENDENT_AMBULATORY_CARE_PROVIDER_SITE_OTHER): Payer: Medicare Other | Admitting: Family Medicine

## 2014-11-22 ENCOUNTER — Encounter: Payer: Self-pay | Admitting: Family Medicine

## 2014-11-22 VITALS — BP 130/74 | HR 71 | Resp 16 | Ht 66.0 in | Wt 182.0 lb

## 2014-11-22 DIAGNOSIS — N529 Male erectile dysfunction, unspecified: Secondary | ICD-10-CM | POA: Diagnosis not present

## 2014-11-22 DIAGNOSIS — I10 Essential (primary) hypertension: Secondary | ICD-10-CM | POA: Diagnosis not present

## 2014-11-22 DIAGNOSIS — E663 Overweight: Secondary | ICD-10-CM

## 2014-11-22 DIAGNOSIS — J3089 Other allergic rhinitis: Secondary | ICD-10-CM

## 2014-11-22 DIAGNOSIS — Z13220 Encounter for screening for lipoid disorders: Secondary | ICD-10-CM | POA: Diagnosis not present

## 2014-11-22 DIAGNOSIS — Z125 Encounter for screening for malignant neoplasm of prostate: Secondary | ICD-10-CM | POA: Diagnosis not present

## 2014-11-22 MED ORDER — SILDENAFIL CITRATE 100 MG PO TABS: ORAL_TABLET | ORAL | Status: DC

## 2014-11-22 NOTE — Patient Instructions (Addendum)
Annual wellness in mid September, call if you need me before   Fasting lipid, chem 7, CBC, PSA and TSH first week  in September  I will send in viagra 100 mg one tablet only, BREAK in half and use the 50 mg tablet  It is important that you exercise regularly at least 30 minutes 5 times a week. If you develop chest pain, have severe difficulty breathing, or feel very tired, stop exercising immediately and seek medical attention    You will get info for [place] care at the county

## 2014-11-22 NOTE — Progress Notes (Signed)
   Subjective:    Patient ID: Bill Ward, male    DOB: [birth year], 69 y.o.   MRN: [id number]  HPI The PT is here for follow up and re-evaluation of chronic medical conditions, medication management and review of any available recent lab and radiology data.  Preventive health is updated, specifically  Cancer screening and Immunization.   . The PT states viagra troche was ineffective, needs script for regular viagra and will try to buy one at a time. Has no known CAD and is not on a nitrate  There are no new concerns.  Keeps active and reports a lot of vegetable intake       Review of Systems See HPI Denies recent fever or chills. Denies sinus pressure, nasal congestion, ear pain or sore throat. Denies chest congestion, productive cough or wheezing. Denies chest pains, palpitations and leg swelling Denies abdominal pain, nausea, vomiting,diarrhea or constipation.   Denies dysuria, frequency, hesitancy or incontinence. Denies joint pain, swelling and limitation in mobility. Denies headaches, seizures, numbness, or tingling. Denies depression, anxiety or insomnia. Denies skin break down or rash.        Objective:   Physical Exam  BP 130/74 mmHg  Pulse 71  Resp 16  Ht 5\' 6"  (1.676 m)  Wt 182 lb (82.555 kg)  BMI 29.39 kg/m2  SpO2 99% Patient alert and oriented and in no cardiopulmonary distress.  HEENT: No facial asymmetry, EOMI,   oropharynx pink and moist.  Neck supple no JVD, no mass.  Chest: Clear to auscultation bilaterally.  CVS: S1, S2 no murmurs, no S3.Regular rate.  ABD: Soft non tender.   Ext: No edema  MS: Adequate ROM spine, shoulders, hips and knees.  Skin: Intact, no ulcerations or rash noted.  Psych: Good eye contact, normal affect. Memory intact not anxious or depressed appearing.  CNS: CN 2-12 intact, power,  normal throughout.no focal deficits noted.       Assessment & Plan:  Essential hypertension Controlled, no change in  medication DASH diet and commitment to daily physical activity for a minimum of 30 minutes discussed and encouraged, as a part of hypertension management. The importance of attaining a healthy weight is also discussed.  BP/Weight 11/22/2014 05/23/2014 03/22/2014 08/10/2013 03/11/2013 09/10/2012 03/10/2012  Systolic BP 130 120 150 140 124 136 124  Diastolic BP 74 70 78 70 68 62 68  Wt. (Lbs) 182 177.12 180 177 174.12 183.04 179.12  BMI 29.39 28.6 29.07 27.72 27.26 28.66 28.05         Erectile dysfunction Difficulty in attaining and attaing erection, vigra 100 mg tab prescribed, pt to break in half and use lowest effective dose   Allergic rhinitis Mild , no need for regular medication, will rely on "as needed" OTC    Overweight (BMI 25.0-29.9) Deteriorated. Patient re-educated about  the importance of commitment to a  minimum of 150 minutes of exercise per week.  The importance of healthy food choices with portion control discussed. Encouraged to start a food diary, count calories and to consider  joining a support group. Sample diet sheets offered. Goals set by the patient for the next several months.   Weight /BMI 11/22/2014 05/23/2014 03/22/2014  WEIGHT 182 lb 177 lb 1.9 oz 180 lb  HEIGHT 5\' 6"  5\' 6"  5\' 6"   BMI 29.39 kg/m2 28.6 kg/m2 29.07 kg/m2    Current exercise per week 200 minutes.

## 2014-11-23 DIAGNOSIS — E663 Overweight: Secondary | ICD-10-CM | POA: Insufficient documentation

## 2014-11-23 NOTE — Assessment & Plan Note (Signed)
Controlled, no change in medication DASH diet and commitment to daily physical activity for a minimum of 30 minutes discussed and encouraged, as a part of hypertension management. The importance of attaining a healthy weight is also discussed.  BP/Weight 11/22/2014 05/23/2014 03/22/2014 08/10/2013 03/11/2013 09/10/2012 03/10/2012  Systolic BP 130 120 150 140 124 136 124  Diastolic BP 74 70 78 70 68 62 68  Wt. (Lbs) 182 177.12 180 177 174.12 183.04 179.12  BMI 29.39 28.6 29.07 27.72 27.26 28.66 28.05

## 2014-11-23 NOTE — Assessment & Plan Note (Signed)
Difficulty in attaining and attaing erection, vigra 100 mg tab prescribed, pt to break in half and use lowest effective dose

## 2014-11-23 NOTE — Assessment & Plan Note (Signed)
Deteriorated. Patient re-educated about  the importance of commitment to a  minimum of 150 minutes of exercise per week.  The importance of healthy food choices with portion control discussed. Encouraged to start a food diary, count calories and to consider  joining a support group. Sample diet sheets offered. Goals set by the patient for the next several months.   Weight /BMI 11/22/2014 05/23/2014 03/22/2014  WEIGHT 182 lb 177 lb 1.9 oz 180 lb  HEIGHT 5\' 6"  5\' 6"  5\' 6"   BMI 29.39 kg/m2 28.6 kg/m2 29.07 kg/m2    Current exercise per week 200 minutes.

## 2014-11-23 NOTE — Assessment & Plan Note (Signed)
Mild , no need for regular medication, will rely on "as needed" OTC

## 2014-12-28 ENCOUNTER — Other Ambulatory Visit: Payer: Self-pay | Admitting: Family Medicine

## 2015-04-07 DIAGNOSIS — Z13220 Encounter for screening for lipoid disorders: Secondary | ICD-10-CM | POA: Diagnosis not present

## 2015-04-07 DIAGNOSIS — Z125 Encounter for screening for malignant neoplasm of prostate: Secondary | ICD-10-CM | POA: Diagnosis not present

## 2015-04-07 DIAGNOSIS — I10 Essential (primary) hypertension: Secondary | ICD-10-CM | POA: Diagnosis not present

## 2015-04-07 LAB — LIPID PANEL
CHOL/HDL RATIO: 2.8 ratio (ref <=5.0)
Cholesterol: 165 mg/dL (ref 125–200)
HDL: 59 mg/dL (ref >=40)
LDL Cholesterol: 86 mg/dL (ref <130)
TRIGLYCERIDES: 98 mg/dL (ref <150)
VLDL: 20 mg/dL (ref <30)

## 2015-04-07 LAB — BASIC METABOLIC PANEL
BUN: 16 mg/dL (ref 7–25)
CALCIUM: 8.8 mg/dL (ref 8.6–10.3)
CO2: 26 mmol/L (ref 20–31)
Chloride: 103 mmol/L (ref 98–110)
Creat: 0.99 mg/dL (ref 0.70–1.18)
Glucose, Bld: 100 mg/dL — ABNORMAL HIGH (ref 65–99)
Potassium: 4.6 mmol/L (ref 3.5–5.3)
SODIUM: 137 mmol/L (ref 135–146)

## 2015-04-07 LAB — CBC WITH DIFFERENTIAL/PLATELET
BASOS PCT: 1 % (ref 0–1)
Basophils Absolute: 0 10*3/uL (ref 0.0–0.1)
EOS ABS: 0.2 10*3/uL (ref 0.0–0.7)
Eosinophils Relative: 4 % (ref 0–5)
HCT: 37.3 % — ABNORMAL LOW (ref 39.0–52.0)
HEMOGLOBIN: 12.1 g/dL — AB (ref 13.0–17.0)
Lymphocytes Relative: 39 % (ref 12–46)
Lymphs Abs: 1.9 10*3/uL (ref 0.7–4.0)
MCH: 29.7 pg (ref 26.0–34.0)
MCHC: 32.4 g/dL (ref 30.0–36.0)
MCV: 91.4 fL (ref 78.0–100.0)
MONO ABS: 0.2 10*3/uL (ref 0.1–1.0)
MPV: 12.2 fL (ref 8.6–12.4)
Monocytes Relative: 5 % (ref 3–12)
NEUTROS ABS: 2.4 10*3/uL (ref 1.7–7.7)
Neutrophils Relative %: 51 % (ref 43–77)
Platelets: 154 10*3/uL (ref 150–400)
RBC: 4.08 MIL/uL — ABNORMAL LOW (ref 4.22–5.81)
RDW: 12.3 % (ref 11.5–15.5)
WBC: 4.8 10*3/uL (ref 4.0–10.5)

## 2015-04-07 LAB — TSH: TSH: 1.182 u[IU]/mL (ref 0.350–4.500)

## 2015-04-08 LAB — PSA, MEDICARE: PSA: 1.57 ng/mL (ref <=4.00)

## 2015-04-25 ENCOUNTER — Encounter: Payer: Medicare Other | Admitting: Family Medicine

## 2015-05-01 ENCOUNTER — Ambulatory Visit (INDEPENDENT_AMBULATORY_CARE_PROVIDER_SITE_OTHER): Payer: Medicare Other | Admitting: Family Medicine

## 2015-05-01 ENCOUNTER — Encounter: Payer: Self-pay | Admitting: Family Medicine

## 2015-05-01 VITALS — BP 128/70 | HR 72 | Resp 16 | Ht 66.0 in | Wt 180.0 lb

## 2015-05-01 DIAGNOSIS — Z1159 Encounter for screening for other viral diseases: Secondary | ICD-10-CM

## 2015-05-01 DIAGNOSIS — R7302 Impaired glucose tolerance (oral): Secondary | ICD-10-CM

## 2015-05-01 DIAGNOSIS — Z0000 Encounter for general adult medical examination without abnormal findings: Secondary | ICD-10-CM | POA: Diagnosis not present

## 2015-05-01 DIAGNOSIS — I10 Essential (primary) hypertension: Secondary | ICD-10-CM

## 2015-05-01 DIAGNOSIS — D508 Other iron deficiency anemias: Secondary | ICD-10-CM

## 2015-05-01 DIAGNOSIS — Z23 Encounter for immunization: Secondary | ICD-10-CM

## 2015-05-01 DIAGNOSIS — E663 Overweight: Secondary | ICD-10-CM

## 2015-05-01 NOTE — Progress Notes (Signed)
Subjective:    Patient ID: Bill Ward, male    DOB: 18-Oct-1945, 69 y.o.   MRN: [id number]  HPI  Preventive Screening-Counseling & Management   Patient present here today for a Medicare annual wellness visit.   Current Problems (verified)   Medications Prior to Visit Allergies (verified)   PAST HISTORY  Family History (verified)   Social History    Risk Factors  Current exercise habits: Single, never married, Worked at [employer] for 30+ years. Former smoker, quit 30 years ago   Dietary issues discussed: Low fat, low carb heart healthy    Cardiac risk factors: Walks mostly everyday, mowing grass, [place] week   Depression Screen  (Note: if answer to either of the following is "Yes", a more complete depression screening is indicated)   Over the past two weeks, have you felt down, depressed or hopeless? No  Over the past two weeks, have you felt little interest or pleasure in doing things? No  Have you lost interest or pleasure in daily life? No  Do you often feel hopeless? No  Do you cry easily over simple problems? No   Activities of Daily Living  In your present state of health, do you have any difficulty performing the following activities?  Driving?: hasn't drove for years, rides [employer] bus  Managing money?: No Feeding yourself?:No Getting from bed to chair?:No Climbing a flight of stairs?:No Preparing food and eating?:No Bathing or showering?:No Getting dressed?:No Getting to the toilet?:No Using the toilet?:No Moving around from place to place?: No  Fall Risk Assessment In the past year have you fallen or had a near fall?:No Are you currently taking any medications that make you dizzy?:No   Hearing Difficulties: No Do you often ask people to speak up or repeat themselves?:No Do you experience ringing or noises in your ears?:No Do you have difficulty understanding soft or whispered voices?:No  Cognitive Testing  Alert? Yes  Normal Appearance?Yes  Oriented to person? Yes Place? Yes  Time? Yes  Displays appropriate judgment?Yes  Can read the correct time from a watch face? yes Are you having problems remembering things?No  Advanced Directives have been discussed with the patient?Yes, will give brochure  , full code   List the Names of Other Physician/Practitioners you currently use:    Indicate any recent Medical Services you may have received from other than [facility] providers in the past year (date may be approximate).   Assessment:    Annual Wellness Exam   Plan:     Medicare Attestation  I have personally reviewed:  The patient's medical and social history  Their use of alcohol, tobacco or illicit drugs  Their current medications and supplements  The patient's functional ability including ADLs,fall risks, home safety risks, cognitive, and hearing and visual impairment  Diet and physical activities  Evidence for depression or mood disorders  The patient's weight, height, BMI, and visual acuity have been recorded in the chart. I have made referrals, counseling, and provided education to the patient based on review of the above and I have provided the patient with a written personalized care plan for preventive services.     Review of Systems     Objective:   Physical Exam BP 128/70 mmHg  Pulse 72  Resp 16  Ht  (1.676 m)  Wt 180 lb (81.647 kg)  BMI 29.07 kg/m2  SpO2 96%       Assessment & Plan:  Need for  prophylactic vaccination and inoculation against influenza After obtaining informed consent, the vaccine is  administered by LPN.   Medicare annual wellness visit, subsequent Annual exam as documented. Counseling done  re healthy lifestyle involving commitment to 150 minutes exercise per week, heart healthy diet, and attaining healthy weight.The importance of adequate sleep also discussed. Regular seat belt use and home safety, is also discussed. Changes in health habits are  decided on by the patient with goals and time frames  set for achieving them. Immunization and cancer screening needs are specifically addressed at this visit.

## 2015-05-01 NOTE — Assessment & Plan Note (Signed)
After obtaining informed consent, the vaccine is  administered by LPN.  

## 2015-05-01 NOTE — Patient Instructions (Signed)
Annual physical exam in 4.5  month, call if you need me before  Flu vaccine today  Check pharmacy for shingles vaccine, you still need this    Pls cut back on sweets, blood sugar SLIGHTLY  high this time  HBA1C, chem 7 , Hep C, CBC, iron and ferritin in 4.5 month  Keep active, and work on losing a few pounds  Thanks for choosing [facility], we consider it a privelige to serve you.

## 2015-05-01 NOTE — Assessment & Plan Note (Signed)

## 2015-06-28 ENCOUNTER — Other Ambulatory Visit: Payer: Self-pay | Admitting: Family Medicine

## 2015-07-05 ENCOUNTER — Other Ambulatory Visit: Payer: Self-pay

## 2015-07-05 MED ORDER — LISINOPRIL 10 MG PO TABS: 10.0000 mg | ORAL_TABLET | Freq: Every day | ORAL | Status: DC

## 2015-07-05 MED ORDER — DOXAZOSIN MESYLATE 4 MG PO TABS: 4.0000 mg | ORAL_TABLET | Freq: Every day | ORAL | Status: DC

## 2015-08-08 DIAGNOSIS — Z1159 Encounter for screening for other viral diseases: Secondary | ICD-10-CM | POA: Diagnosis not present

## 2015-08-08 DIAGNOSIS — D508 Other iron deficiency anemias: Secondary | ICD-10-CM | POA: Diagnosis not present

## 2015-08-08 DIAGNOSIS — I10 Essential (primary) hypertension: Secondary | ICD-10-CM | POA: Diagnosis not present

## 2015-08-08 DIAGNOSIS — R7302 Impaired glucose tolerance (oral): Secondary | ICD-10-CM | POA: Diagnosis not present

## 2015-08-08 LAB — CBC
HCT: 38.2 % — ABNORMAL LOW (ref 39.0–52.0)
HEMOGLOBIN: 12.3 g/dL — AB (ref 13.0–17.0)
MCH: 30 pg (ref 26.0–34.0)
MCHC: 32.2 g/dL (ref 30.0–36.0)
MCV: 93.2 fL (ref 78.0–100.0)
MPV: 11.6 fL (ref 8.6–12.4)
Platelets: 174 10*3/uL (ref 150–400)
RBC: 4.1 MIL/uL — ABNORMAL LOW (ref 4.22–5.81)
RDW: 12.4 % (ref 11.5–15.5)
WBC: 7.3 10*3/uL (ref 4.0–10.5)

## 2015-08-09 LAB — HEPATITIS C ANTIBODY: HCV Ab: NEGATIVE

## 2015-08-09 LAB — BASIC METABOLIC PANEL
BUN: 19 mg/dL (ref 7–25)
CO2: 27 mmol/L (ref 20–31)
Calcium: 9.1 mg/dL (ref 8.6–10.3)
Chloride: 103 mmol/L (ref 98–110)
Creat: 1 mg/dL (ref 0.70–1.18)
Glucose, Bld: 97 mg/dL (ref 65–99)
POTASSIUM: 5.1 mmol/L (ref 3.5–5.3)
SODIUM: 139 mmol/L (ref 135–146)

## 2015-08-09 LAB — IRON: Iron: 52 ug/dL (ref 50–180)

## 2015-08-09 LAB — HEMOGLOBIN A1C
Hgb A1c MFr Bld: 5.3 % (ref <5.7)
Mean Plasma Glucose: 105 mg/dL (ref <117)

## 2015-08-09 LAB — FERRITIN: FERRITIN: 446 ng/mL — AB (ref 22–322)

## 2015-08-23 ENCOUNTER — Encounter: Payer: Self-pay | Admitting: Family Medicine

## 2015-08-23 ENCOUNTER — Ambulatory Visit (INDEPENDENT_AMBULATORY_CARE_PROVIDER_SITE_OTHER): Payer: Medicare Other | Admitting: Family Medicine

## 2015-08-23 VITALS — BP 130/60 | HR 97 | Resp 16 | Ht 66.0 in | Wt 178.0 lb

## 2015-08-23 DIAGNOSIS — I10 Essential (primary) hypertension: Secondary | ICD-10-CM | POA: Diagnosis not present

## 2015-08-23 DIAGNOSIS — Z1211 Encounter for screening for malignant neoplasm of colon: Secondary | ICD-10-CM | POA: Diagnosis not present

## 2015-08-23 LAB — POC HEMOCCULT BLD/STL (OFFICE/1-CARD/DIAGNOSTIC): FECAL OCCULT BLD: NEGATIVE

## 2015-08-23 NOTE — Progress Notes (Signed)
   Subjective:    Patient ID: Bill Ward, male    DOB: [birth year], 70 y.o.   MRN: [id number]  HPI   Bill Ward     MRN: [id number]      DOB: 1946-03-24   HPI Bill Ward is here for follow up and re-evaluation of chronic medical conditions, medication management and review of any available recent lab and radiology data.  Preventive health is updated, specifically  Cancer screening and Immunization.   Questions or concerns regarding consultations or procedures which the PT has had in the interim are  addressed. The PT denies any adverse reactions to current medications since the last visit.  There are no new concerns.  There are no specific complaints   ROS Denies recent fever or chills. Denies sinus pressure, nasal congestion, ear pain or sore throat. Denies chest congestion, productive cough or wheezing. Denies chest pains, palpitations and leg swelling Denies abdominal pain, nausea, vomiting,diarrhea or constipation.   Denies dysuria, frequency, hesitancy or incontinence. Denies joint pain, swelling and limitation in mobility. Denies headaches, seizures, numbness, or tingling. Denies depression, anxiety or insomnia. Denies skin break down or rash.   PE  BP 130/60 mmHg  Pulse 97  Resp 16  Ht  (1.676 m)  Wt 178 lb (80.74 kg)  BMI 28.74 kg/m2  SpO2 97%  Patient alert and oriented and in no cardiopulmonary distress.  HEENT: No facial asymmetry, EOMI,   oropharynx pink and moist.  Neck supple no JVD, no mass.  Chest: Clear to auscultation bilaterally.  CVS: S1, S2 no murmurs, no S3.Regular rate.  ABD: Soft non tender. No organomegaly or n mass, normal BS Rectal: prostate smooth , heme negative stool, no mass  Ext: No edema  MS: Adequate ROM spine, shoulders, hips and knees.  Skin: Intact, no ulcerations or rash noted.  Psych: Good eye contact, normal affect. Memory intact not anxious or depressed appearing.  CNS: CN 2-12 intact, power,  normal  throughout.no focal deficits noted.   Assessment & Plan  Essential hypertension Controlled, no change in medication DASH diet and commitment to daily physical activity for a minimum of 30 minutes discussed and encouraged, as a part of hypertension management. The importance of attaining a healthy weight is also discussed.  BP/Weight 08/23/2015 05/01/2015 11/22/2014 05/23/2014 03/22/2014 08/10/2013 03/11/2013  Systolic BP 130 128 130 120 150 140 124  Diastolic BP 60 70 74 70 78 70 68  Wt. (Lbs) 178 180 182 177.12 180 177 174.12  BMI 28.74 29.07 29.39 28.6 29.07 27.72 27.26        Special screening for malignant neoplasms, colon No mass on exam, heme negative stool         Review of Systems     Objective:   Physical Exam        Assessment & Plan:

## 2015-08-23 NOTE — Patient Instructions (Signed)
Annual wellness Sept 27 or after, call if you need me sooner  Excellent blood pressure and labs  Exam today is good  No change in medication  Continue to keep active and eat healthily  Thanks for choosing [facility], we consider it a privelige to serve you.  All the best for 2017!

## 2015-08-27 DIAGNOSIS — Z1211 Encounter for screening for malignant neoplasm of colon: Secondary | ICD-10-CM | POA: Insufficient documentation

## 2015-08-27 NOTE — Assessment & Plan Note (Signed)
No mass on exam, heme negative stool

## 2015-08-27 NOTE — Assessment & Plan Note (Signed)
Controlled, no change in medication DASH diet and commitment to daily physical activity for a minimum of 30 minutes discussed and encouraged, as a part of hypertension management. The importance of attaining a healthy weight is also discussed.  BP/Weight 08/23/2015 05/01/2015 11/22/2014 05/23/2014 03/22/2014 08/10/2013 03/11/2013  Systolic BP 130 128 130 120 150 140 124  Diastolic BP 60 70 74 70 78 70 68  Wt. (Lbs) 178 180 182 177.12 180 177 174.12  BMI 28.74 29.07 29.39 28.6 29.07 27.72 27.26

## 2015-11-06 ENCOUNTER — Other Ambulatory Visit: Payer: Self-pay | Admitting: Family Medicine

## 2016-04-24 ENCOUNTER — Other Ambulatory Visit: Payer: Self-pay | Admitting: Family Medicine

## 2016-05-06 ENCOUNTER — Ambulatory Visit (INDEPENDENT_AMBULATORY_CARE_PROVIDER_SITE_OTHER): Payer: Medicare Other

## 2016-05-06 VITALS — BP 140/72 | HR 74 | Resp 18 | Ht 66.0 in | Wt 182.0 lb

## 2016-05-06 DIAGNOSIS — Z0000 Encounter for general adult medical examination without abnormal findings: Secondary | ICD-10-CM | POA: Diagnosis not present

## 2016-05-06 NOTE — Patient Instructions (Signed)
Thank you for choosing [facility] for your health care needs  The Annual Wellness Visit is designed to allow us the chance to assist you in preserving and improving you health.   Dr. Lodema HongSimpson will see you back in 4 months  Labs needed will be mailed to you with the date to have them done  Please return to get your flu shot or go to your local pharmacy  If you have any concerns please don't hesitate to call.  The new # is [phone]

## 2016-05-06 NOTE — Progress Notes (Signed)
Subjective:    Bill Ward is a 70 y.o. male who presents for Medicare Annual/Subsequent preventive examination.   Preventive Screening-Counseling & Management  Tobacco History  Smoking Status  . Former Smoker  Smokeless Tobacco  . Never Used    Current Problems (verified) Patient Active Problem List   Diagnosis Date Noted  . Special screening for malignant neoplasms, colon 08/27/2015  . Medicare annual wellness visit, subsequent 05/01/2015  . Overweight (BMI 25.0-29.9) 11/23/2014  . Erectile dysfunction 03/22/2014  . Essential hypertension 11/18/2007  . Allergic rhinitis 11/18/2007    Medications Prior to Visit Current Outpatient Prescriptions on File Prior to Visit  Medication Sig Dispense Refill  . aspirin 81 MG tablet Take 81 mg by mouth daily.      Marland Kitchen doxazosin (CARDURA) 4 MG tablet Take 1 tablet by mouth  daily 90 tablet 1  . lisinopril (PRINIVIL,ZESTRIL) 10 MG tablet Take 1 tablet by mouth  daily 90 tablet 1   No current facility-administered medications on file prior to visit.     Current Medications (verified) Current Outpatient Prescriptions  Medication Sig Dispense Refill  . aspirin 81 MG tablet Take 81 mg by mouth daily.      Marland Kitchen doxazosin (CARDURA) 4 MG tablet Take 1 tablet by mouth  daily 90 tablet 1  . lisinopril (PRINIVIL,ZESTRIL) 10 MG tablet Take 1 tablet by mouth  daily 90 tablet 1   No current facility-administered medications for this visit.      Allergies (verified) Review of patient's allergies indicates no known allergies.   PAST HISTORY  Family History Family History  Problem Relation Age of Onset  . Hypertension Mother   . Cancer Father     bone  . Diabetes Sister   . Hypertension Sister   . Hypertension Brother     Social History Social History  Substance Use Topics  . Smoking status: Former Games developer  . Smokeless tobacco: Never Used  . Alcohol use No    Are there smokers in your home (other than you)?  Yes  Risk  Factors Current exercise habits: The patient does not participate in regular exercise at present.  Stays active by doing yard work.  Dietary issues discussed: Heart healthy low fat diet encouraged    Cardiac risk factors: advanced age (older than 46 for men, 73 for women), dyslipidemia, hypertension, male gender and smoking/ tobacco exposure.  Depression Screen (Note: if answer to either of the following is "Yes", a more complete depression screening is indicated)   Q1: Over the past two weeks, have you felt down, depressed or hopeless? No  Q2: Over the past two weeks, have you felt little interest or pleasure in doing things? No  Have you lost interest or pleasure in daily life? No  Do you often feel hopeless? No  Do you cry easily over simple problems? No  Activities of Daily Living In your present state of health, do you have any difficulty performing the following activities?:  Driving? Yes Managing money?  No Feeding yourself? No Getting from bed to chair? No Climbing a flight of stairs? No Preparing food and eating?: No Bathing or showering? No Getting dressed: No Getting to the toilet? No Using the toilet:No Moving around from place to place: No In the past year have you fallen or had a near fall?:No   Are you sexually active?  Yes  Do you have more than one partner?  No  Hearing Difficulties: Yes Do you often ask people to speak  up or repeat themselves? Yes Do you experience ringing or noises in your ears? No Do you have difficulty understanding soft or whispered voices? Yes   Do you feel that you have a problem with memory? No  Do you often misplace items? No  Do you feel safe at home?  Yes  Cognitive Testing  Alert? Yes  Normal Appearance?Yes  Oriented to person? Yes  Place? Yes   Time? Yes  Recall of three objects?  Yes  Can perform simple calculations? Yes  Displays appropriate judgment?Yes  Can read the correct time from a watch face?Yes   Advanced  Directives have been discussed with the patient? No   List the Names of Other Physician/Practitioners you currently use: 1.    Indicate any recent Medical Services you may have received from other than [facility] providers in the past year (date may be approximate).  Immunization History  Administered Date(s) Administered  . Influenza Split 07/09/2011, 05/07/2012  . Influenza Whole 05/14/2005, 06/19/2009, 06/26/2010  . Influenza,inj,Quad PF,36+ Mos 05/12/2013, 05/23/2014, 05/01/2015  . Pneumococcal Conjugate-13 03/22/2014  . Pneumococcal Polysaccharide-23 02/07/2009  . Td 11/08/2004    Screening Tests Health Maintenance  Topic Date Due  . ZOSTAVAX  05/30/2016 (Originally 02/01/2004)  . INFLUENZA VACCINE  06/13/2016 (Originally 03/05/2016)  . TETANUS/TDAP  12/17/2017 (Originally 11/09/2014)  . COLONOSCOPY  05/21/2016  . Hepatitis C Screening  Completed  . PNA vac Low Risk Adult  Completed    All answers were reviewed with the patient and necessary referrals were made:  Durwin NoraSlade, Courtney Blackwell, LPN   69/6/295210/09/2015   History reviewed: allergies, current medications, past family history, past medical history, past social history, past surgical history and problem list  Review of Systems A comprehensive review of systems was negative.    Objective:     Vision by Snellen chart: right eye:20/20, left eye:20/20 Blood pressure 140/72, pulse 74, resp. rate 18, height 5\' 6"  (1.676 m), weight 182 lb 0.6 oz (82.6 kg), SpO2 98 %. Body mass index is 29.38 kg/m.  No exam performed today, annual wellness without physical exam.     Assessment:     Plan:     During the course of the visit the patient was educated and counseled about appropriate screening and preventive services including:    Influenza vaccine  Diet review for nutrition referral? Yes ____  Not Indicated __x__   Patient Instructions (the written plan) was given to the patient.  Medicare Attestation I have personally  reviewed: The patient's medical and social history Their use of alcohol, tobacco or illicit drugs Their current medications and supplements The patient's functional ability including ADLs,fall risks, home safety risks, cognitive, and hearing and visual impairment Diet and physical activities Evidence for depression or mood disorders  The patient's weight, height, BMI, and visual acuity have been recorded in the chart.  I have made referrals, counseling, and provided education to the patient based on review of the above and I have provided the patient with a written personalized care plan for preventive services.     Kandis FantasiaSlade, Courtney [place], [place]   [id number]/1/324410/09/2015

## 2016-05-13 ENCOUNTER — Ambulatory Visit (INDEPENDENT_AMBULATORY_CARE_PROVIDER_SITE_OTHER): Payer: Medicare Other

## 2016-05-13 DIAGNOSIS — Z23 Encounter for immunization: Secondary | ICD-10-CM | POA: Diagnosis not present

## 2016-09-16 ENCOUNTER — Encounter (INDEPENDENT_AMBULATORY_CARE_PROVIDER_SITE_OTHER): Payer: Self-pay | Admitting: *Deleted

## 2016-09-16 ENCOUNTER — Encounter: Payer: Self-pay | Admitting: Family Medicine

## 2016-09-16 ENCOUNTER — Ambulatory Visit (INDEPENDENT_AMBULATORY_CARE_PROVIDER_SITE_OTHER): Payer: Medicare Other | Admitting: Family Medicine

## 2016-09-16 VITALS — BP 120/62 | HR 87 | Resp 15 | Ht 66.0 in | Wt 182.0 lb

## 2016-09-16 DIAGNOSIS — Z125 Encounter for screening for malignant neoplasm of prostate: Secondary | ICD-10-CM | POA: Diagnosis not present

## 2016-09-16 DIAGNOSIS — Z1211 Encounter for screening for malignant neoplasm of colon: Secondary | ICD-10-CM

## 2016-09-16 DIAGNOSIS — I10 Essential (primary) hypertension: Secondary | ICD-10-CM

## 2016-09-16 DIAGNOSIS — J302 Other seasonal allergic rhinitis: Secondary | ICD-10-CM

## 2016-09-16 DIAGNOSIS — E663 Overweight: Secondary | ICD-10-CM | POA: Diagnosis not present

## 2016-09-16 LAB — CBC
HCT: 39.5 % (ref 38.5–50.0)
Hemoglobin: 12.6 g/dL — ABNORMAL LOW (ref 13.2–17.1)
MCH: 29.4 pg (ref 27.0–33.0)
MCHC: 31.9 g/dL — ABNORMAL LOW (ref 32.0–36.0)
MCV: 92.3 fL (ref 80.0–100.0)
MPV: 11.7 fL (ref 7.5–12.5)
Platelets: 191 10*3/uL (ref 140–400)
RBC: 4.28 MIL/uL (ref 4.20–5.80)
RDW: 12.7 % (ref 11.0–15.0)
WBC: 4.5 10*3/uL (ref 3.8–10.8)

## 2016-09-16 LAB — BASIC METABOLIC PANEL
BUN: 16 mg/dL (ref 7–25)
CO2: 29 mmol/L (ref 20–31)
Calcium: 9.9 mg/dL (ref 8.6–10.3)
Chloride: 101 mmol/L (ref 98–110)
Creat: 1.11 mg/dL (ref 0.70–1.18)
Glucose, Bld: 95 mg/dL (ref 65–99)
POTASSIUM: 5 mmol/L (ref 3.5–5.3)
Sodium: 135 mmol/L (ref 135–146)

## 2016-09-16 LAB — LIPID PANEL
Cholesterol: 136 mg/dL (ref <200)
HDL: 51 mg/dL (ref >40)
LDL Cholesterol: 68 mg/dL (ref <100)
Total CHOL/HDL Ratio: 2.7 Ratio (ref <5.0)
Triglycerides: 86 mg/dL (ref <150)
VLDL: 17 mg/dL (ref <30)

## 2016-09-16 LAB — PSA: PSA: 1.5 ng/mL (ref <=4.0)

## 2016-09-16 MED ORDER — DOXAZOSIN MESYLATE 4 MG PO TABS: 4.0000 mg | ORAL_TABLET | Freq: Every day | ORAL | 1 refills | Status: DC

## 2016-09-16 MED ORDER — LISINOPRIL 10 MG PO TABS: 10.0000 mg | ORAL_TABLET | Freq: Every day | ORAL | 1 refills | Status: DC

## 2016-09-16 NOTE — Patient Instructions (Addendum)
Annual physical exam in September, call if you need me before  Labs today, CBC, chem 7, lipid and PSA  It is important that you exercise regularly at least 30 minutes 5 times a week. If you develop chest pain, have severe difficulty breathing, or feel very tired, stop exercising immediately and seek medical attention   You are referred to Dr Karilyn Cotaehman for your colonoscopy, his office will mail you a letter asking you to call to schedule an appointment  No change in medication Please work on good  health habits so that your health will improve. 1. Commitment to daily physical activity for 30 to 60  minutes, if you are able to do this.  2. Commitment to wise food choices. Aim for half of your  food intake to be vegetable and fruit, one quarter starchy foods, and one quarter protein. Try to eat on a regular schedule  3 meals per day, snacking between meals should be limited to vegetables or fruits or small portions of nuts. 64 ounces of water per day is generally recommended, unless you have specific health conditions, like heart failure or kidney failure where you will need to limit fluid intake.  3. Commitment to sufficient and a  good quality of physical and mental rest daily, generally between 6 to 8 hours per day.  WITH PERSISTANCE AND PERSEVERANCE, THE IMPOSSIBLE , BECOMES THE NORM! Thank you  for choosing [facility]. We consider it a privelige to serve you.  Delivering excellent health care in a caring and  compassionate way is our goal.  Partnering with you,  so that together we can achieve this goal is our strategy.

## 2016-09-17 NOTE — Assessment & Plan Note (Signed)
Controlled, no change in medication DASH diet and commitment to daily physical activity for a minimum of 30 minutes discussed and encouraged, as a part of hypertension management. The importance of attaining a healthy weight is also discussed.  BP/Weight 09/16/2016 05/06/2016 08/23/2015 05/01/2015 11/22/2014 05/23/2014 03/22/2014  Systolic BP 120 140 130 128 130 120 150  Diastolic BP 62 72 60 70 74 70 78  Wt. (Lbs) 182 182.04 178 180 182 177.12 180  BMI 29.38 29.38 28.74 29.07 29.39 28.6 29.07

## 2016-09-17 NOTE — Progress Notes (Signed)
   Bill FabianJake J Wisnieski     MRN: [id number]      DOB: 1946/07/17   HPI Bill Ward is here for follow up and re-evaluation of chronic medical conditions, medication management and review of any available recent lab and radiology data.  Preventive health is updated, specifically  Cancer screening and Immunization.   Questions or concerns regarding consultations or procedures which the PT has had in the interim are  addressed. The PT denies any adverse reactions to current medications since the last visit.  There are no new concerns.  There are no specific complaints   ROS Denies recent fever or chills. Denies sinus pressure, nasal congestion, ear pain or sore throat. Denies chest congestion, productive cough or wheezing. Denies chest pains, palpitations and leg swelling Denies abdominal pain, nausea, vomiting,diarrhea or constipation.   Denies dysuria, frequency, hesitancy or incontinence. Denies joint pain, swelling and limitation in mobility. Denies headaches, seizures, numbness, or tingling. Denies depression, anxiety or insomnia. Denies skin break down or rash.   PE  BP 120/62   Pulse 87   Resp 15   Ht 5\' 6"  (1.676 m)   Wt 182 lb (82.6 kg)   SpO2 98%   BMI 29.38 kg/m   Patient alert and oriented and in no cardiopulmonary distress.  HEENT: No facial asymmetry, EOMI,   oropharynx pink and moist.  Neck supple no JVD, no mass.  Chest: Clear to auscultation bilaterally.  CVS: S1, S2 no murmurs, no S3.Regular rate.  ABD: Soft non tender.   Ext: No edema  MS: Adequate ROM spine, shoulders, hips and knees.  Skin: Intact, no ulcerations or rash noted.  Psych: Good eye contact, normal affect. Memory intact not anxious or depressed appearing.  CNS: CN 2-12 intact, power,  normal throughout.no focal deficits noted.   Assessment & Plan  Essential hypertension Controlled, no change in medication DASH diet and commitment to daily physical activity for a minimum of 30  minutes discussed and encouraged, as a part of hypertension management. The importance of attaining a healthy weight is also discussed.  BP/Weight 09/16/2016 05/06/2016 08/23/2015 05/01/2015 11/22/2014 05/23/2014 03/22/2014  Systolic BP 120 140 130 128 130 120 150  Diastolic BP 62 72 60 70 74 70 78  Wt. (Lbs) 182 182.04 178 180 182 177.12 180  BMI 29.38 29.38 28.74 29.07 29.39 28.6 29.07       Overweight (BMI 25.0-29.9) Unchanged Patient re-educated about  the importance of commitment to a  minimum of 150 minutes of exercise per week.  The importance of healthy food choices with portion control discussed. Encouraged to start a food diary, count calories and to consider  joining a support group. Sample diet sheets offered. Goals set by the patient for the next several months.   Weight /BMI 09/16/2016 05/06/2016 08/23/2015  WEIGHT 182 lb 182 lb 0.6 oz 178 lb  HEIGHT 5\' 6"  5\' 6"  5\' 6"   BMI 29.38 kg/m2 29.38 kg/m2 28.74 kg/m2      Allergic rhinitis No current flare

## 2016-09-17 NOTE — Assessment & Plan Note (Signed)
No current flare 

## 2016-09-17 NOTE — Assessment & Plan Note (Signed)
Unchanged Patient re-educated about  the importance of commitment to a  minimum of 150 minutes of exercise per week.  The importance of healthy food choices with portion control discussed. Encouraged to start a food diary, count calories and to consider  joining a support group. Sample diet sheets offered. Goals set by the patient for the next several months.   Weight /BMI 09/16/2016 05/06/2016 08/23/2015  WEIGHT 182 lb 182 lb 0.6 oz 178 lb  HEIGHT 5\' 6"  5\' 6"  5\' 6"   BMI 29.38 kg/m2 29.38 kg/m2 28.74 kg/m2

## 2016-09-26 ENCOUNTER — Other Ambulatory Visit (INDEPENDENT_AMBULATORY_CARE_PROVIDER_SITE_OTHER): Payer: Self-pay | Admitting: *Deleted

## 2016-09-26 DIAGNOSIS — Z1211 Encounter for screening for malignant neoplasm of colon: Secondary | ICD-10-CM | POA: Insufficient documentation

## 2016-11-08 ENCOUNTER — Encounter (INDEPENDENT_AMBULATORY_CARE_PROVIDER_SITE_OTHER): Payer: Self-pay | Admitting: *Deleted

## 2016-11-08 ENCOUNTER — Telehealth (INDEPENDENT_AMBULATORY_CARE_PROVIDER_SITE_OTHER): Payer: Self-pay | Admitting: *Deleted

## 2016-11-08 MED ORDER — PEG 3350-KCL-NA BICARB-NACL 420 G PO SOLR: 4000.0000 mL | Freq: Once | ORAL | 0 refills | Status: AC

## 2016-11-08 NOTE — Telephone Encounter (Signed)
Patient needs trilyte 

## 2016-12-04 ENCOUNTER — Telehealth (INDEPENDENT_AMBULATORY_CARE_PROVIDER_SITE_OTHER): Payer: Self-pay | Admitting: *Deleted

## 2016-12-04 NOTE — Telephone Encounter (Signed)
Referring MD/PCP: simpson   Procedure: tcs  Reason/Indication:  screening  Has patient had this procedure before?  Yes, over 10 yrs ago  If so, when, by whom and where?    Is there a family history of colon cancer?  no  Who?  What age when diagnosed?    Is patient diabetic?   no      Does patient have prosthetic heart valve or mechanical valve?  no  Do you have a pacemaker?  no  Has patient ever had endocarditis? no  Has patient had joint replacement within last 12 months?  no  Does patient tend to be constipated or take laxatives? no  Does patient have a history of alcohol/drug use?  no  Is patient on Coumadin, Plavix and/or Aspirin? yes  Medications: see epic  Allergies: nkda  Medication Adjustment per Dr Karilyn Cota: asa 2 days  Procedure date & time: 01/02/17 at 930

## 2016-12-05 NOTE — Telephone Encounter (Signed)
agree

## 2017-01-02 ENCOUNTER — Encounter (HOSPITAL_COMMUNITY)
Admission: RE | Disposition: A | Discharge status: Home / Self Care | Payer: Self-pay | Source: Ambulatory Visit | Attending: Internal Medicine

## 2017-01-02 ENCOUNTER — Ambulatory Visit (HOSPITAL_COMMUNITY)
Admission: RE | Admit: 2017-01-02 | Discharge: 2017-01-02 | Disposition: A | Discharge status: Home / Self Care | Payer: Medicare Other | Source: Ambulatory Visit | Attending: Internal Medicine | Admitting: Internal Medicine

## 2017-01-02 ENCOUNTER — Encounter (HOSPITAL_COMMUNITY): Payer: Self-pay | Admitting: *Deleted

## 2017-01-02 DIAGNOSIS — Z79899 Other long term (current) drug therapy: Secondary | ICD-10-CM | POA: Diagnosis not present

## 2017-01-02 DIAGNOSIS — Z7982 Long term (current) use of aspirin: Secondary | ICD-10-CM | POA: Insufficient documentation

## 2017-01-02 DIAGNOSIS — K644 Residual hemorrhoidal skin tags: Secondary | ICD-10-CM | POA: Insufficient documentation

## 2017-01-02 DIAGNOSIS — Z87891 Personal history of nicotine dependence: Secondary | ICD-10-CM | POA: Diagnosis not present

## 2017-01-02 DIAGNOSIS — I10 Essential (primary) hypertension: Secondary | ICD-10-CM | POA: Diagnosis not present

## 2017-01-02 DIAGNOSIS — Z1211 Encounter for screening for malignant neoplasm of colon: Secondary | ICD-10-CM | POA: Diagnosis not present

## 2017-01-02 HISTORY — PX: COLONOSCOPY: SHX5424

## 2017-01-02 SURGERY — COLONOSCOPY
Anesthesia: Moderate Sedation

## 2017-01-02 MED ORDER — STERILE WATER FOR IRRIGATION IR SOLN
Status: DC | PRN
  Administered 2017-01-02: 2.5 mL

## 2017-01-02 MED ORDER — SODIUM CHLORIDE 0.9 % IV SOLN
INTRAVENOUS | Status: DC
  Administered 2017-01-02: 09:00:00 via INTRAVENOUS

## 2017-01-02 NOTE — Op Note (Signed)
[facility] Patient Name: Bill Ward Procedure Date: 01/02/2017 9:09 AM MRN: [id number] Date of Birth: [birth year] Attending MD: Lionel December , MD CSN: [id number] Age: 71 Admit Type: Outpatient Procedure:                Colonoscopy Indications:              Screening for colorectal malignant neoplasm Providers:                Lionel December, MD, Jannett Celestine, RN, Nena Polio, RN Referring MD:             Milus Mallick. Lodema Hong, MD Medicines:                None Complications:            No immediate complications. Estimated Blood Loss:     Estimated blood loss: none. Procedure:                Pre-Anesthesia Assessment:                           - Prior to the procedure, a History and Physical                            was performed, and patient medications and                            allergies were reviewed. The patient's tolerance of                            previous anesthesia was also reviewed. The risks                            and benefits of the procedure and the sedation                            options and risks were discussed with the patient.                            All questions were answered, and informed consent                            was obtained. Prior Anticoagulants: The patient                            last took aspirin 2 days prior to the procedure.                            ASA Grade Assessment: I - A normal, healthy                            patient. After reviewing the risks and benefits,                            the patient was deemed in satisfactory condition to  undergo the procedure.                           After obtaining informed consent, the colonoscope                            was passed under direct vision. Throughout the                            procedure, the patient's blood pressure, pulse, and                            oxygen saturations were monitored continuously. The      [id number] ([id number]) scope was introduced through                            the anus and advanced to the the cecum, identified                            by appendiceal orifice and ileocecal valve. The                            colonoscopy was performed without difficulty. The                            patient tolerated the procedure well. The quality                            of the bowel preparation was good. The ileocecal                            valve, appendiceal orifice, and rectum were                            photographed. Scope In: 9:20:12 AM Scope Out: 9:37:08 AM Scope Withdrawal Time: 0 hours 7 minutes 9 seconds  Total Procedure Duration: 0 hours 16 minutes 56 seconds  Findings:      The perianal and digital rectal examinations were normal.      The colon (entire examined portion) appeared normal.      External hemorrhoids were found during retroflexion. The hemorrhoids       were small. Impression:               - The entire examined colon is normal.                           - External hemorrhoids.                           - No specimens collected. Moderate Sedation:      None Recommendation:           - Patient has a contact number available for                            emergencies. The signs and symptoms of potential  delayed complications were discussed with the                            patient. Return to normal activities tomorrow.                            Written discharge instructions were provided to the                            patient.                           - Resume previous diet today.                           - Continue present medications.                           - No repeat colonoscopy due to the absence of                            advanced adenomas. Procedure Code(s):        --- Professional ---                           45378, Colonoscopy, flexible; [phone], including                             collection of specimen(s) by brushing or washing,                            when performed (separate procedure) Diagnosis Code(s):        --- Professional ---                           Z12.11, Encounter for screening for malignant                            neoplasm of colon                           K64.4, Residual hemorrhoidal skin tags CPT copyright 2016 American Medical Association. All rights reserved. The codes documented in this report are preliminary and upon coder review may  be revised to meet current compliance requirements. Lionel DecemberNajeeb Rehman, MD Lionel DecemberNajeeb Rehman, MD 01/02/2017 9:42:12 AM This report has been signed electronically. Number of Addenda: 0

## 2017-01-02 NOTE — Discharge Instructions (Signed)
Resume usual medications and diet.  Colonoscopy, Adult, Care After This sheet gives you information about how to care for yourself after your procedure. Your health care provider may also give you more specific instructions. If you have problems or questions, contact your health care provider.  Dr Karilyn Cotaehman  [phone]  (after hours call the hospital and ask for the GI doctor on call to be paged) What can I expect after the procedure? After the procedure, it is common to have:  A small amount of blood in your stool for 24 hours after the procedure.  Some gas.  Mild abdominal cramping or bloating.  Follow these instructions at home  Take over-the-counter or prescription medicines only as told by your health care provider.  It is up to you to get the results of your procedure. Ask your health care provider, or the department performing the procedure, when your results will be ready. Eating and drinking  Drink enough fluid to keep your urine clear or pale yellow.  Resume your normal diet as instructed by your health care provider. Contact a health care provider if:  You have blood in your stool 2-3 days after the procedure. Get help right away if:  You have more than a small spotting of blood in your stool.  You pass large blood clots in your stool.  Your abdomen is swollen.  You have nausea or vomiting.  You have a fever.  You have increasing abdominal pain that is not relieved with medicine. This information is not intended to replace advice given to you by your health care provider. Make sure you discuss any questions you have with your health care provider. Document Released: 03/05/2004 Document Revised: 04/15/2016 Document Reviewed: 10/03/2015 Elsevier Interactive Patient Education  [employer]2018 Elsevier Inc.

## 2017-01-02 NOTE — H&P (Signed)
Bill Ward is an 71 y.o. male.   Chief Complaint: Patient is here for colonoscopy. HPI: Patient is 71 year old African-American male who is in for screening colonoscopy. Last exam was about 10 years ago. He denies abdominal pain change in bowel habits or rectal bleeding. Family history is negative for CRC. Patient does not have right home. He wants to proceed with test without conscious sedation.  Past Medical History:  Diagnosis Date  . Allergy   . Erectile dysfunction   . Essential hypertension, benign     Past Surgical History:  Procedure Laterality Date  . COLONOSCOPY      Family History  Problem Relation Age of Onset  . Hypertension Mother   . Cancer Father        bone  . Diabetes Sister   . Hypertension Sister   . Hypertension Brother   . Colon cancer Neg Hx    Social History:  reports that he has quit smoking. His smoking use included Cigarettes. He has a 17.50 pack-year smoking history. He has never used smokeless tobacco. He reports that he does not drink alcohol or use drugs.  Allergies: No Known Allergies  Medications Prior to Admission  Medication Sig Dispense Refill  . aspirin 81 MG tablet Take 81 mg by mouth daily.      Marland Kitchen. doxazosin (CARDURA) 4 MG tablet Take 1 tablet (4 mg total) by mouth daily. (Patient taking differently: Take 4 mg by mouth at bedtime. ) 90 tablet 1  . lisinopril (PRINIVIL,ZESTRIL) 10 MG tablet Take 1 tablet (10 mg total) by mouth daily. 90 tablet 1    No results found for this or any previous visit (from the past 48 hour(s)). No results found.  ROS  Blood pressure (!) 188/87, pulse 100, temperature 97.9 F (36.6 C), temperature source Oral, resp. rate (!) 22, height 5\' 6"  (1.676 m), weight 180 lb (81.6 kg), SpO2 100 %. Physical Exam  Constitutional: He appears well-developed and well-nourished.  HENT:  Mouth/Throat: Oropharynx is clear and moist.  Eyes: Conjunctivae are normal. No scleral icterus.  Neck: No thyromegaly present.   Cardiovascular: Normal rate, regular rhythm and normal heart sounds.   No murmur heard. Respiratory: Effort normal and breath sounds normal.  GI: Soft. He exhibits no distension and no mass. There is no tenderness.  Musculoskeletal: He exhibits edema.  Lymphadenopathy:    He has no cervical adenopathy.  Neurological: He is alert.  Skin: Skin is warm and dry.     Assessment/Plan Average risk screening colonoscopy.  Lionel DecemberNajeeb Uel Davidow, MD 01/02/2017, 9:15 AM

## 2017-01-02 NOTE — OR Nursing (Signed)
Patient here for a colonoscopy. Patient states he has nobody to drive him home and there is not anybody that he can call. Patient states he took a cab to get here. Spoke with Dr. Karilyn Cotaehman and he will attempt to do colonoscopy without any sedation.

## 2017-01-03 ENCOUNTER — Encounter (HOSPITAL_COMMUNITY): Payer: Self-pay | Admitting: Internal Medicine

## 2017-03-11 ENCOUNTER — Telehealth: Payer: Self-pay

## 2017-03-11 NOTE — Telephone Encounter (Signed)
Called pt to schedule Medicare Annual Wellness Visit. -nr    Nicole Nashawn Hillock, B.A.  Care Guide [phone]  

## 2017-03-20 ENCOUNTER — Other Ambulatory Visit: Payer: Self-pay | Admitting: Family Medicine

## 2017-04-16 ENCOUNTER — Ambulatory Visit (INDEPENDENT_AMBULATORY_CARE_PROVIDER_SITE_OTHER): Payer: Medicare Other | Admitting: Family Medicine

## 2017-04-16 ENCOUNTER — Encounter: Payer: Self-pay | Admitting: Family Medicine

## 2017-04-16 VITALS — BP 158/80 | HR 85 | Temp 97.5°F | Resp 18 | Ht 66.0 in | Wt 184.8 lb

## 2017-04-16 DIAGNOSIS — I10 Essential (primary) hypertension: Secondary | ICD-10-CM

## 2017-04-16 DIAGNOSIS — Z0000 Encounter for general adult medical examination without abnormal findings: Secondary | ICD-10-CM

## 2017-04-16 DIAGNOSIS — Z23 Encounter for immunization: Secondary | ICD-10-CM

## 2017-04-16 MED ORDER — LISINOPRIL 20 MG PO TABS: 20.0000 mg | ORAL_TABLET | Freq: Every day | ORAL | 3 refills | Status: DC

## 2017-04-16 NOTE — Patient Instructions (Signed)
Nurse BP check 2nd week in October  MD f/u  In  4.5 months, call if you need me sooner  Blood pressure is high, you need to increase medication that you take in the morning  To lisinopril 10 mg TWO daily, new medication when it comes in will be 20 mg one daily  It is important that you exercise regularly at least 30 minutes 5 times a week. If you develop chest pain, have severe difficulty breathing, or feel very tired, stop exercising immediately and seek medical attention    Thank you  for choosing [facility]. We consider it a privelige to serve you.  Delivering excellent health care in a caring and  compassionate way is our goal.  Partnering with you,  so that together we can achieve this goal is our strategy.

## 2017-04-18 DIAGNOSIS — Z0000 Encounter for general adult medical examination without abnormal findings: Secondary | ICD-10-CM | POA: Insufficient documentation

## 2017-04-18 NOTE — Progress Notes (Signed)
   Bill Ward     MRN: [id number]      DOB: 02-Feb-1946   HPI: Patient is in for annual physical exam. Blood pressure is elevated and needs to be addressed. Recent labs, if available are reviewed. Immunization is reviewed , and  updated if needed.    PE; BP (!) 158/80 (BP Location: Left Arm, Patient Position: Sitting, Cuff Size: Normal)   Pulse 85   Temp (!) 97.5 F (36.4 C) (Other (Comment))   Resp 18   Ht  (1.676 m)   Wt 184 lb 12 oz (83.8 kg)   SpO2 98%   BMI 29.82 kg/m  Pleasant male, alert and oriented x 3, in no cardio-pulmonary distress. Afebrile. HEENT No facial trauma or asymetry. Sinuses non tender. EOMI, pupils equally reactive to light. External ears normal, tympanic membranes clear. Oropharynx moist, no exudate. Neck: supple, no adenopathy,JVD or thyromegaly.No bruits.  Chest: Clear to ascultation bilaterally.No crackles or wheezes. Non tender to palpation  Breast: No asymetry,no masses. No nipple discharge or inversion. No axillary or supraclavicular adenopathy  Cardiovascular system; Heart sounds normal,  S1 and  S2 ,no S3.  No murmur, or thrill. Apical beat not displaced Peripheral pulses normal.  Abdomen: Soft, non tender, no organomegaly or masses. No bruits. Bowel sounds normal. No guarding, tenderness or rebound.  Rectal:  Normal sphincter tone. No hemorrhoids or  masses. guaiac negative stool. Prostate smooth and firm    Musculoskeletal exam: Full ROM of spine, hips , shoulders and knees. No deformity ,swelling or crepitus noted. No muscle wasting or atrophy.   Neurologic: Cranial nerves 2 to 12 intact. Power, tone ,sensation and reflexes normal throughout. No disturbance in gait. No tremor.  Skin: Intact, no ulceration, erythema , scaling or rash noted. Pigmentation normal throughout  Psych; Normal mood and affect. Judgement and concentration normal   Assessment & Plan:  Annual physical exam Annual exam as  documented.  Immunization and cancer screening needs are specifically addressed at this visit.   Essential hypertension Uncontrolled,increase medication dose DASH diet and commitment to daily physical activity for a minimum of 30 minutes discussed and encouraged, as a part of hypertension management. The importance of attaining a healthy weight is also discussed.  BP/Weight 04/16/2017 01/02/2017 09/16/2016 05/06/2016 08/23/2015 05/01/2015 11/22/2014  Systolic BP 158 159 120 140 130 128 130  Diastolic BP 80 78 62 72 60 70 74  Wt. (Lbs) 184.75 180 182 182.04 178 180 182  BMI 29.82 29.05 29.38 29.38 28.74 29.07 29.39     Nurse BP re eval in 5 weeks

## 2017-04-18 NOTE — Assessment & Plan Note (Signed)
Uncontrolled,increase medication dose DASH diet and commitment to daily physical activity for a minimum of 30 minutes discussed and encouraged, as a part of hypertension management. The importance of attaining a healthy weight is also discussed.  BP/Weight 04/16/2017 01/02/2017 09/16/2016 05/06/2016 08/23/2015 05/01/2015 11/22/2014  Systolic BP 158 159 120 140 130 128 130  Diastolic BP 80 78 62 72 60 70 74  Wt. (Lbs) 184.75 180 182 182.04 178 180 182  BMI 29.82 29.05 29.38 29.38 28.74 29.07 29.39     Nurse BP re eval in 5 weeks

## 2017-04-18 NOTE — Assessment & Plan Note (Addendum)
Annual exam as documented. . Immunization and cancer screening needs are specifically addressed at this visit.  

## 2017-05-14 ENCOUNTER — Ambulatory Visit: Payer: Medicare Other

## 2017-05-14 VITALS — BP 136/70 | HR 98

## 2017-05-14 DIAGNOSIS — I10 Essential (primary) hypertension: Secondary | ICD-10-CM

## 2017-05-14 NOTE — Progress Notes (Signed)
Patient came in today for BP check- no changes in medication. He states that when he was last seen for OV, his BP was high and he was told to return before his next visit to have it rechecked.

## 2017-06-02 ENCOUNTER — Other Ambulatory Visit: Payer: Self-pay | Admitting: Family Medicine

## 2017-06-30 ENCOUNTER — Ambulatory Visit (INDEPENDENT_AMBULATORY_CARE_PROVIDER_SITE_OTHER): Payer: Medicare Other

## 2017-06-30 VITALS — BP 150/70 | HR 80 | Temp 98.4°F | Resp 16 | Ht 66.0 in | Wt 190.2 lb

## 2017-06-30 DIAGNOSIS — Z0000 Encounter for general adult medical examination without abnormal findings: Secondary | ICD-10-CM

## 2017-06-30 NOTE — Patient Instructions (Addendum)
[name] , Thank you for taking time to come for your Medicare Wellness Visit. I appreciate your ongoing commitment to your health goals. Please review the following plan we discussed and let me know if I can assist you in the future.   Screening recommendations/referrals: Colonoscopy: Done- no repeat due to age Recommended yearly ophthalmology/optometry visit for glaucoma screening and checkup Recommended yearly dental visit for hygiene and checkup  Vaccinations: Influenza vaccine: Done Pneumococcal vaccine: 2020 Tdap vaccine: Discuss with PCP Shingles vaccine: Discuss with PCP    Advanced directives: Discussed in office today- declined at this time  Conditions/risks identified: Fall  Next appointment: 09/03/2017  Preventive Care 65 Years and Older, Male Preventive care refers to lifestyle choices and visits with your health care provider that can promote health and wellness. What does preventive care include?  A yearly physical exam. This is also called an annual well check.  Dental exams once or twice a year.  Routine eye exams. Ask your health care provider how often you should have your eyes checked.  Personal lifestyle choices, including:  Daily care of your teeth and gums.  Regular physical activity.  Eating a healthy diet.  Avoiding tobacco and drug use.  Limiting alcohol use.  Practicing safe sex.  Taking low doses of aspirin every day.  Taking vitamin and mineral supplements as recommended by your health care provider. What happens during an annual well check? The services and screenings done by your health care provider during your annual well check will depend on your age, overall health, lifestyle risk factors, and family history of disease. Counseling  Your health care provider may ask you questions about your:  Alcohol use.  Tobacco use.  Drug use.  Emotional well-being.  Home and relationship well-being.  Sexual activity.  Eating  habits.  History of falls.  Memory and ability to understand (cognition).  Work and work Astronome[employer]vironment. Screening  You may have the following tests or measurements:  Height, weight, and BMI.  Blood pressure.  Lipid and cholesterol levels. These may be checked every 5 years, or more frequently if you are over 708 years old.  Skin check.  Lung cancer screening. You may have this screening every year starting at age 73 if you have a 30-pack-year history of smoking and currently smoke or have quit within the past 15 years.  Fecal occult blood test (FOBT) of the stool. You may have this test every year starting at age 73.  Flexible sigmoidoscopy or colonoscopy. You may have a sigmoidoscopy every 5 years or a colonoscopy every 10 years starting at age 73.  Prostate cancer screening. Recommendations will vary depending on your family history and other risks.  Hepatitis C blood test.  Hepatitis B blood test.  Sexually transmitted disease (STD) testing.  Diabetes screening. This is done by checking your blood sugar (glucose) after you have not eaten for a while (fasting). You may have this done every 1-3 years.  Abdominal aortic aneurysm (AAA) screening. You may need this if you are a current or former smoker.  Osteoporosis. You may be screened starting at age 73 if you are at high risk. Talk with your health care provider about your test results, treatment options, and if necessary, the need for more tests. Vaccines  Your health care provider may recommend certain vaccines, such as:  Influenza vaccine. This is recommended every year.  Tetanus, diphtheria, and acellular pertussis (Tdap, Td) vaccine. You may need a Td booster every 10 years.  Zoster  vaccine. You may need this after age 30.  Pneumococcal 13-valent conjugate (PCV13) vaccine. One dose is recommended after age 40.  Pneumococcal polysaccharide (PPSV23) vaccine. One dose is recommended after age 72. Talk to your health  care provider about which screenings and vaccines you need and how often you need them. This information is not intended to replace advice given to you by your health care provider. Make sure you discuss any questions you have with your health care provider. Document Released: 08/18/2015 Document Revised: 04/10/2016 Document Reviewed: 05/23/2015 Elsevier Interactive Patient Education  2017 [place] Prevention in the Home Falls can cause injuries. They can happen to people of all ages. There are many things you can do to make your home safe and to help prevent falls. What can I do on the outside of my home?  Regularly fix the edges of walkways and driveways and fix any cracks.  Remove anything that might make you trip as you walk through a door, such as a raised step or threshold.  Trim any bushes or trees on the path to your home.  Use bright outdoor lighting.  Clear any walking paths of anything that might make someone trip, such as rocks or tools.  Regularly check to see if handrails are loose or broken. Make sure that both sides of any steps have handrails.  Any raised decks and porches should have guardrails on the edges.  Have any leaves, snow, or ice cleared regularly.  Use sand or salt on walking paths during winter.  Clean up any spills in your garage right away. This includes oil or grease spills. What can I do in the bathroom?  Use night lights.  Install grab bars by the toilet and in the tub and shower. Do not use towel bars as grab bars.  Use non-skid mats or decals in the tub or shower.  If you need to sit down in the shower, use a plastic, non-slip stool.  Keep the floor dry. Clean up any water that spills on the floor as soon as it happens.  Remove soap buildup in the tub or shower regularly.  Attach bath mats securely with double-sided non-slip rug tape.  Do not have throw rugs and other things on the floor that can make you trip. What can I do  in the bedroom?  Use night lights.  Make sure that you have a light by your bed that is easy to reach.  Do not use any sheets or blankets that are too big for your bed. They should not hang down onto the floor.  Have a firm chair that has side arms. You can use this for support while you get dressed.  Do not have throw rugs and other things on the floor that can make you trip. What can I do in the kitchen?  Clean up any spills right away.  Avoid walking on wet floors.  Keep items that you use a lot in easy-to-reach places.  If you need to reach something above you, use a strong step stool that has a grab bar.  Keep electrical cords out of the way.  Do not use floor polish or wax that makes floors slippery. If you must use wax, use non-skid floor wax.  Do not have throw rugs and other things on the floor that can make you trip. What can I do with my stairs?  Do not leave any items on the stairs.  Make sure that there are handrails  on both sides of the stairs and use them. Fix handrails that are broken or loose. Make sure that handrails are as long as the stairways.  Check any carpeting to make sure that it is firmly attached to the stairs. Fix any carpet that is loose or worn.  Avoid having throw rugs at the top or bottom of the stairs. If you do have throw rugs, attach them to the floor with carpet tape.  Make sure that you have a light switch at the top of the stairs and the bottom of the stairs. If you do not have them, ask someone to add them for you. What else can I do to help prevent falls?  Wear shoes that:  Do not have high heels.  Have rubber bottoms.  Are comfortable and fit you well.  Are closed at the toe. Do not wear sandals.  If you use a stepladder:  Make sure that it is fully opened. Do not climb a closed stepladder.  Make sure that both sides of the stepladder are locked into place.  Ask someone to hold it for you, if possible.  Clearly mark  and make sure that you can see:  Any grab bars or handrails.  First and last steps.  Where the edge of each step is.  Use tools that help you move around (mobility aids) if they are needed. These include:  Canes.  Walkers.  Scooters.  Crutches.  Turn on the lights when you go into a dark area. Replace any light bulbs as soon as they burn out.  Set up your furniture so you have a clear path. Avoid moving your furniture around.  If any of your floors are uneven, fix them.  If there are any pets around you, be aware of where they are.  Review your medicines with your doctor. Some medicines can make you feel dizzy. This can increase your chance of falling. Ask your doctor what other things that you can do to help prevent falls. This information is not intended to replace advice given to you by your health care provider. Make sure you discuss any questions you have with your health care provider. Document Released: 05/18/2009 Document Revised: 12/28/2015 Document Reviewed: 08/26/2014 Elsevier Interactive Patient Education  2017 [employer] Preventive Care for Adults  A healthy lifestyle and preventive care can promote health and wellness. Preventive health guidelines for adults include the following key practices.  . A routine yearly physical is a good way to check with your health care provider about your health and preventive screening. It is a chance to share any concerns and updates on your health and to receive a thorough exam.  . Visit your dentist for a routine exam and preventive care every 6 months. Brush your teeth twice a day and floss once a day. Good oral hygiene prevents tooth decay and gum disease.  . The frequency of eye exams is based on your age, health, family medical history, use  of contact lenses, and other factors. Follow your health care provider's ecommendations for frequency of eye exams.  . Eat a healthy diet. Foods like vegetables, fruits, whole  grains, low-fat dairy products, and lean protein foods contain the nutrients you need without too many calories. Decrease your intake of foods high in solid fats, added sugars, and salt. Eat the right amount of calories for you. Get information about a proper diet from your health care provider, if necessary.  . Regular physical exercise is one of the  most important things you can do for your health. Most adults should get at least 150 minutes of moderate-intensity exercise (any activity that increases your heart rate and causes you to sweat) each week. In addition, most adults need muscle-strengthening exercises on 2 or more days a week.  Silver Sneakers may be a benefit available to you. To determine eligibility, you may visit the website: www.silversneakers.com or contact program at 210-266-4045 Mon-Fri between 8AM-8PM.   . Maintain a healthy weight. The body mass index (BMI) is a screening tool to identify possible weight problems. It provides an estimate of body fat based on height and weight. Your health care provider can find your BMI and can help you achieve or maintain a healthy weight.   For adults 20 years and older: ? A BMI below 18.5 is considered underweight. ? A BMI of 18.5 to 24.9 is normal. ? A BMI of 25 to 29.9 is considered overweight. ? A BMI of 30 and above is considered obese.   . Maintain normal blood lipids and cholesterol levels by exercising and minimizing your intake of saturated fat. Eat a balanced diet with plenty of fruit and vegetables. Blood tests for lipids and cholesterol should begin at age 40 and be repeated every 5 years. If your lipid or cholesterol levels are high, you are over 50, or you are at high risk for heart disease, you may need your cholesterol levels checked more frequently. Ongoing high lipid and cholesterol levels should be treated with medicines if diet and exercise are not working.  . If you smoke, find out from your health care provider how to  quit. If you do not use tobacco, please do not start.  . If you choose to drink alcohol, please do not consume more than 2 drinks per day. One drink is considered to be 12 ounces (355 mL) of beer, 5 ounces (148 mL) of wine, or 1.5 ounces (44 mL) of liquor.  . If you are 57-71 years old, ask your health care provider if you should take aspirin to prevent strokes.  . Use sunscreen. Apply sunscreen liberally and repeatedly throughout the day. You should seek shade when your shadow is shorter than you. Protect yourself by wearing long sleeves, pants, a wide-brimmed hat, and sunglasses year round, whenever you are outdoors.  . Once a month, do a whole body skin exam, using a mirror to look at the skin on your back. Tell your health care provider of new moles, moles that have irregular borders, moles that are larger than a pencil eraser, or moles that have changed in shape or color.  Please bring a copy of your POA (Power of [place]) and/or Living Will to your next appointment.

## 2017-06-30 NOTE — Progress Notes (Addendum)
Subjective:   Bill Ward is a 71 y.o. male who presents for Medicare Annual/Subsequent preventive examination.  Review of Systems:         Objective:    Vitals: There were no vitals taken for this visit.  There is no height or weight on file to calculate BMI.  Tobacco Social History   Tobacco Use  Smoking Status Former Smoker  . Packs/day: 0.50  . Years: 35.00  . Pack years: 17.50  . Types: Cigarettes  Smokeless Tobacco Never Used     Counseling given: Not Answered   Past Medical History:  Diagnosis Date  . Allergy   . Erectile dysfunction   . Essential hypertension, benign    Past Surgical History:  Procedure Laterality Date  . COLONOSCOPY    . COLONOSCOPY N/A 01/02/2017   Procedure: COLONOSCOPY;  Surgeon: Malissa Hippoehman, Najeeb U, MD;  Location: [facility];  Service: Endoscopy;  Laterality: N/A;  930   Family History  Problem Relation Age of Onset  . Hypertension Mother   . Cancer Father        bone  . Diabetes Sister   . Hypertension Sister   . Hypertension Brother   . Colon cancer Neg Hx    Social History   Substance and Sexual Activity  Sexual Activity Yes    Outpatient Encounter Medications as of 06/30/2017  Medication Sig  . aspirin 81 MG tablet Take 81 mg by mouth daily.    Marland Kitchen. doxazosin (CARDURA) 4 MG tablet TAKE 1 TABLET BY MOUTH  DAILY  . lisinopril (PRINIVIL,ZESTRIL) 20 MG tablet Take 1 tablet (20 mg total) by mouth daily.   No facility-administered encounter medications on file as of 06/30/2017.     Activities of Daily Living No flowsheet data found.  Patient Care Team: Kerri PerchesSimpson, Margaret E, MD as PCP - General   Assessment:     Exercise Activities and Dietary recommendations    Goals    None     Fall Risk Fall Risk  04/16/2017 05/06/2016 08/23/2015 05/01/2015 11/22/2014  Falls in the past year? No No No No No   Depression Screen PHQ 2/9 Scores 04/16/2017 05/06/2016 05/01/2015 03/11/2013  PHQ - 2 Score 0 0 0 0    Cognitive  Function        Immunization History  Administered Date(s) Administered  . Influenza Split 07/09/2011, 05/07/2012  . Influenza Whole 05/14/2005, 06/19/2009, 06/26/2010  . Influenza,inj,Quad PF,6+ Mos 05/12/2013, 05/23/2014, 05/01/2015, 05/13/2016, 04/16/2017  . Pneumococcal Conjugate-13 03/22/2014  . Pneumococcal Polysaccharide-23 02/07/2009  . Td 11/08/2004   Screening Tests Health Maintenance  Topic Date Due  . TETANUS/TDAP  12/17/2017 (Originally 11/09/2014)  . COLONOSCOPY  01/03/2027  . INFLUENZA VACCINE  Completed  . Hepatitis C Screening  Completed  . PNA vac Low Risk Adult  Completed      Plan:     I have personally reviewed and noted the following in the patient's chart:   . Medical and social history . Use of alcohol, tobacco or illicit drugs  . Current medications and supplements . Functional ability and status . Nutritional status . Physical activity . Advanced directives . List of other physicians . Hospitalizations, surgeries, and ER visits in previous 12 months . Vitals . Screenings to include cognitive, depression, and falls . Referrals and appointments  In addition, I have reviewed and discussed with patient certain preventive protocols, quality metrics, and best practice recommendations. A written personalized care plan for preventive services as well as general  preventive health recommendations were provided to patient.     Mack HookBreanna  Johana Hopkinson, LPN  [id number]/26/2018

## 2017-07-02 ENCOUNTER — Ambulatory Visit: Payer: Medicare Other

## 2017-07-04 ENCOUNTER — Ambulatory Visit: Payer: Medicare Other

## 2017-09-03 ENCOUNTER — Ambulatory Visit (INDEPENDENT_AMBULATORY_CARE_PROVIDER_SITE_OTHER): Payer: Medicare Other | Admitting: Family Medicine

## 2017-09-03 ENCOUNTER — Encounter: Payer: Self-pay | Admitting: Family Medicine

## 2017-09-03 VITALS — BP 140/80 | HR 91 | Temp 98.6°F | Ht 66.0 in | Wt 184.1 lb

## 2017-09-03 DIAGNOSIS — Z1211 Encounter for screening for malignant neoplasm of colon: Secondary | ICD-10-CM

## 2017-09-03 DIAGNOSIS — Z0000 Encounter for general adult medical examination without abnormal findings: Secondary | ICD-10-CM | POA: Diagnosis not present

## 2017-09-03 DIAGNOSIS — I10 Essential (primary) hypertension: Secondary | ICD-10-CM

## 2017-09-03 LAB — HEMOCCULT GUIAC POC 1CARD (OFFICE): Fecal Occult Blood, POC: NEGATIVE

## 2017-09-03 MED ORDER — DOXAZOSIN MESYLATE 4 MG PO TABS: 4.0000 mg | ORAL_TABLET | Freq: Every day | ORAL | 3 refills | Status: DC

## 2017-09-03 NOTE — Patient Instructions (Signed)
F/u in 6 months, call if you need me soon er       CBC, fasting lipid, chem 7 , PSA end Februaury  It is important that you exercise regularly at least 30 minutes 5 times a week. If you develop chest pain, have severe difficulty breathing, or feel very tired, stop exercising immediately and seek medical attention    Thank you  for choosing [facility]. We consider it a privelige to serve you.  Delivering excellent health care in a caring and  compassionate way is our goal.  Partnering with you,  so that together we can achieve this goal is our strategy.

## 2017-09-03 NOTE — Progress Notes (Signed)
   Bill FabianJake J Ward     MRN: [id number]      DOB: 03-15-1946   HPI: Patient is in for annual physical exam. No other health concerns are expressed or addressed at the visit. . Immunization is reviewed , and  Is up to date    PE; BP 140/80   Pulse 91   Temp 98.6 F (37 C)   Ht 5\' 6"  (1.676 m)   Wt 184 lb 1.9 oz (83.5 kg)   SpO2 97%   BMI 29.72 kg/m  Pleasant male, alert and oriented x 3, in no cardio-pulmonary distress. Afebrile. HEENT No facial trauma or asymetry. Sinuses non tender. EOMI, pupils equally reactive to light. External ears normal, tympanic membranes clear. Oropharynx moist, no exudate. Neck: supple, no adenopathy,JVD or thyromegaly.No bruits.  Chest: Clear to ascultation bilaterally.No crackles or wheezes. Non tender to palpation  Breast: No asymetry,no masses. No nipple discharge or inversion. No axillary or supraclavicular adenopathy  Cardiovascular system; Heart sounds normal,  S1 and  S2 ,no S3.  No murmur, or thrill. Apical beat not displaced Peripheral pulses normal.  Abdomen: Soft, non tender, no organomegaly or masses. No bruits. Bowel sounds normal. No guarding, tenderness or rebound.  Rectal:  Normal sphincter tone. No hemorrhoids or  masses. guaiac negative stool. Prostate smooth and firm    Musculoskeletal exam: Full ROM of spine, hips , shoulders and knees. No deformity ,swelling or crepitus noted. No muscle wasting or atrophy.   Neurologic: Cranial nerves 2 to 12 intact. Power, tone ,sensation and reflexes normal throughout. No disturbance in gait. No tremor.  Skin: Intact, no ulceration, erythema , scaling or rash noted. Pigmentation normal throughout  Psych; Normal mood and affect. Judgement and concentration normal   Assessment & Plan:  Annual physical exam Annual exam as documented. Counseling done  re healthy lifestyle involving commitment to 150 minutes exercise per week, heart healthy diet, and attaining  healthy weight.The importance of adequate sleep also discussed. Home safety, is also discussed. Changes in health habits are decided on by the patient with goals and time frames  set for achieving them. Immunization and cancer screening needs are specifically addressed at this visit.   Special screening for malignant neoplasms, colon Heme negative stool  Essential hypertension Not t at goal, no med change DASH diet and commitment to daily physical activity for a minimum of 30 minutes discussed and encouraged, as a part of hypertension management. The importance of attaining a healthy weight is also discussed.  BP/Weight 09/03/2017 06/30/2017 05/14/2017 04/16/2017 01/02/2017 09/16/2016 05/06/2016  Systolic BP 140 150 136 158 159 120 140  Diastolic BP 80 70 70 80 78 62 72  Wt. (Lbs) 184.12 190.25 - 184.75 180 182 182.04  BMI 29.72 30.71 - 29.82 29.05 29.38 29.38

## 2017-09-03 NOTE — Assessment & Plan Note (Addendum)
Annual exam as documented. Counseling done  re healthy lifestyle involving commitment to 150 minutes exercise per week, heart healthy diet, and attaining healthy weight.The importance of adequate sleep also discussed. Home safety, is also discussed. Changes in health habits are decided on by the patient with goals and time frames  set for achieving them. Immunization and cancer screening needs are specifically addressed at this visit.  

## 2017-09-05 ENCOUNTER — Encounter: Payer: Self-pay | Admitting: Family Medicine

## 2017-09-05 NOTE — Assessment & Plan Note (Signed)
Heme negative stool 

## 2017-09-05 NOTE — Assessment & Plan Note (Signed)
Not t at goal, no med change DASH diet and commitment to daily physical activity for a minimum of 30 minutes discussed and encouraged, as a part of hypertension management. The importance of attaining a healthy weight is also discussed.  BP/Weight 09/03/2017 06/30/2017 05/14/2017 04/16/2017 01/02/2017 09/16/2016 05/06/2016  Systolic BP 140 150 136 158 159 120 140  Diastolic BP 80 70 70 80 78 62 72  Wt. (Lbs) 184.12 190.25 - 184.75 180 182 182.04  BMI 29.72 30.71 - 29.82 29.05 29.38 29.38

## 2017-10-08 DIAGNOSIS — I10 Essential (primary) hypertension: Secondary | ICD-10-CM | POA: Diagnosis not present

## 2017-10-08 DIAGNOSIS — Z0000 Encounter for general adult medical examination without abnormal findings: Secondary | ICD-10-CM | POA: Diagnosis not present

## 2017-10-08 DIAGNOSIS — Z125 Encounter for screening for malignant neoplasm of prostate: Secondary | ICD-10-CM | POA: Diagnosis not present

## 2017-10-09 LAB — BASIC METABOLIC PANEL
BUN / CREAT RATIO: 13 (calc) (ref 6–22)
BUN: 17 mg/dL (ref 7–25)
CO2: 29 mmol/L (ref 20–32)
CREATININE: 1.28 mg/dL — AB (ref 0.70–1.18)
Calcium: 10 mg/dL (ref 8.6–10.3)
Chloride: 103 mmol/L (ref 98–110)
GLUCOSE: 106 mg/dL — AB (ref 65–99)
Potassium: 4.9 mmol/L (ref 3.5–5.3)
Sodium: 137 mmol/L (ref 135–146)

## 2017-10-09 LAB — LIPID PANEL
Cholesterol: 179 mg/dL (ref <200)
HDL: 55 mg/dL (ref >40)
LDL Cholesterol (Calc): 108 mg/dL (calc) — ABNORMAL HIGH
NON-HDL CHOLESTEROL (CALC): 124 mg/dL (ref <130)
Total CHOL/HDL Ratio: 3.3 (calc) (ref <5.0)
Triglycerides: 70 mg/dL (ref <150)

## 2017-10-09 LAB — CBC
HCT: 38.6 % (ref 38.5–50.0)
Hemoglobin: 12.6 g/dL — ABNORMAL LOW (ref 13.2–17.1)
MCH: 29.8 pg (ref 27.0–33.0)
MCHC: 32.6 g/dL (ref 32.0–36.0)
MCV: 91.3 fL (ref 80.0–100.0)
MPV: 12.7 fL — ABNORMAL HIGH (ref 7.5–12.5)
Platelets: 154 10*3/uL (ref 140–400)
RBC: 4.23 10*6/uL (ref 4.20–5.80)
RDW: 11.2 % (ref 11.0–15.0)
WBC: 5.1 10*3/uL (ref 3.8–10.8)

## 2017-10-09 LAB — PSA: PSA: 1.3 ng/mL (ref <=4.0)

## 2018-03-03 ENCOUNTER — Encounter: Payer: Self-pay | Admitting: Family Medicine

## 2018-03-03 ENCOUNTER — Ambulatory Visit (INDEPENDENT_AMBULATORY_CARE_PROVIDER_SITE_OTHER): Payer: Medicare Other | Admitting: Family Medicine

## 2018-03-03 ENCOUNTER — Other Ambulatory Visit: Payer: Self-pay

## 2018-03-03 VITALS — BP 130/56 | HR 74 | Resp 12 | Ht 70.0 in | Wt 185.1 lb

## 2018-03-03 DIAGNOSIS — E663 Overweight: Secondary | ICD-10-CM

## 2018-03-03 DIAGNOSIS — I10 Essential (primary) hypertension: Secondary | ICD-10-CM

## 2018-03-03 DIAGNOSIS — J3089 Other allergic rhinitis: Secondary | ICD-10-CM

## 2018-03-03 MED ORDER — LISINOPRIL 20 MG PO TABS: 20.0000 mg | ORAL_TABLET | Freq: Every day | ORAL | 3 refills | Status: DC

## 2018-03-03 MED ORDER — LISINOPRIL 20 MG PO TABS
20.0000 mg | ORAL_TABLET | Freq: Every day | ORAL | 3 refills | Status: DC
Start: 2018-03-03 — End: 2019-03-17

## 2018-03-03 MED ORDER — DOXAZOSIN MESYLATE 4 MG PO TABS: 4.0000 mg | ORAL_TABLET | Freq: Every day | ORAL | 3 refills | Status: DC

## 2018-03-03 NOTE — Patient Instructions (Addendum)
Wellness with nurse first week in December Pt to have flu vaccine at visit  Annual physical exam with MD last week in March, call if you need me before  Excellent    Labs and exam , no change in medication  Keep eating from the ground and continue to be active!  Thank you  for choosing [facility]. We consider it a privelige to serve you.  Delivering excellent health care in a caring and  compassionate way is our goal.  Partnering with you,  so that together we can achieve this goal is our strategy.

## 2018-03-08 ENCOUNTER — Encounter: Payer: Self-pay | Admitting: Family Medicine

## 2018-03-08 NOTE — Assessment & Plan Note (Signed)
Improved Patient re-educated about  the importance of commitment to a  minimum of 150 minutes of exercise per week.  The importance of healthy food choices with portion control discussed. Encouraged to start a food diary, count calories and to consider  joining a support group. Sample diet sheets offered. Goals set by the patient for the next several months.   Weight /BMI 03/03/2018 09/03/2017 06/30/2017  WEIGHT 185 lb 1.9 oz 184 lb 1.9 oz 190 lb 4 oz  HEIGHT 5\' 10"  5\' 6"  5\' 6"   BMI 26.56 kg/m2 29.72 kg/m2 30.71 kg/m2

## 2018-03-08 NOTE — Assessment & Plan Note (Signed)
No current flare 

## 2018-03-08 NOTE — Assessment & Plan Note (Signed)
Controlled, no change in medication DASH diet and commitment to daily physical activity for a minimum of 30 minutes discussed and encouraged, as a part of hypertension management. The importance of attaining a healthy weight is also discussed.  BP/Weight 03/03/2018 09/03/2017 06/30/2017 05/14/2017 04/16/2017 01/02/2017 09/16/2016  Systolic BP 130 140 150 136 158 159 120  Diastolic BP 56 80 70 70 80 78 62  Wt. (Lbs) 185.12 184.12 190.25 - 184.75 180 182  BMI 26.56 29.72 30.71 - 29.82 29.05 29.38

## 2018-03-08 NOTE — Progress Notes (Signed)
   Bill FabianJake J Ward     MRN: [id number]      DOB: 10/03/1945   HPI Bill Ward is here for follow up and re-evaluation of chronic medical conditions, medication management and review of any available recent lab and radiology data.  Preventive health is updated, specifically  Cancer screening and Immunization.   Questions or concerns regarding consultations or procedures which the PT has had in the interim are  addressed. The PT denies any adverse reactions to current medications since the last visit.  There are no new concerns.  There are no specific complaints   ROS Denies recent fever or chills. Denies sinus pressure, nasal congestion, ear pain or sore throat. Denies chest congestion, productive cough or wheezing. Denies chest pains, palpitations and leg swelling Denies abdominal pain, nausea, vomiting,diarrhea or constipation.   Denies dysuria, frequency, hesitancy or incontinence. Denies joint pain, swelling and limitation in mobility. Denies headaches, seizures, numbness, or tingling. Denies depression, anxiety or insomnia. Denies skin break down or rash.   PE  BP (!) 130/56 (BP Location: Left Arm, Patient Position: Sitting, Cuff Size: Large)   Pulse 74   Resp 12   Ht 5\' 10"  (1.778 m)   Wt 185 lb 1.9 oz (84 kg)   SpO2 99%   BMI 26.56 kg/m   Patient alert and oriented and in no cardiopulmonary distress.  HEENT: No facial asymmetry, EOMI,   oropharynx pink and moist.  Neck supple no JVD, no mass.  Chest: Clear to auscultation bilaterally.  CVS: S1, S2 no murmurs, no S3.Regular rate.  ABD: Soft non tender.   Ext: No edema  MS: Adequate ROM spine, shoulders, hips and knees.  Skin: Intact, no ulcerations or rash noted.  Psych: Good eye contact, normal affect. Memory intact not anxious or depressed appearing.  CNS: CN 2-12 intact, power,  normal throughout.no focal deficits noted.   Assessment & Plan  Essential hypertension Controlled, no change in  medication DASH diet and commitment to daily physical activity for a minimum of 30 minutes discussed and encouraged, as a part of hypertension management. The importance of attaining a healthy weight is also discussed.  BP/Weight 03/03/2018 09/03/2017 06/30/2017 05/14/2017 04/16/2017 01/02/2017 09/16/2016  Systolic BP 130 140 150 136 158 159 120  Diastolic BP 56 80 70 70 80 78 62  Wt. (Lbs) 185.12 184.12 190.25 - 184.75 180 182  BMI 26.56 29.72 30.71 - 29.82 29.05 29.38       Overweight (BMI 25.0-29.9) Improved Patient re-educated about  the importance of commitment to a  minimum of 150 minutes of exercise per week.  The importance of healthy food choices with portion control discussed. Encouraged to start a food diary, count calories and to consider  joining a support group. Sample diet sheets offered. Goals set by the patient for the next several months.   Weight /BMI 03/03/2018 09/03/2017 06/30/2017  WEIGHT 185 lb 1.9 oz 184 lb 1.9 oz 190 lb 4 oz  HEIGHT 5\' 10"  5\' 6"  5\' 6"   BMI 26.56 kg/m2 29.72 kg/m2 30.71 kg/m2      Allergic rhinitis No current flare

## 2018-05-27 ENCOUNTER — Ambulatory Visit (INDEPENDENT_AMBULATORY_CARE_PROVIDER_SITE_OTHER): Payer: Medicare Other

## 2018-05-27 DIAGNOSIS — Z23 Encounter for immunization: Secondary | ICD-10-CM | POA: Diagnosis not present

## 2018-07-06 ENCOUNTER — Ambulatory Visit: Payer: Medicare Other

## 2018-07-07 ENCOUNTER — Ambulatory Visit: Payer: Medicare Other

## 2018-07-07 VITALS — BP 144/69 | HR 96 | Resp 12 | Ht 66.0 in | Wt 188.0 lb

## 2018-07-07 DIAGNOSIS — Z0000 Encounter for general adult medical examination without abnormal findings: Secondary | ICD-10-CM

## 2018-07-07 NOTE — Patient Instructions (Signed)
[name] , Thank you for taking time to come for your Medicare Wellness Visit. I appreciate your ongoing commitment to your health goals. Please review the following plan we discussed and let me know if I can assist you in the future.   Screening recommendations/referrals: Colonoscopy: up to date  Recommended yearly ophthalmology/optometry visit for glaucoma screening and checkup Recommended yearly dental visit for hygiene and checkup  Vaccinations: Influenza vaccine: up to date  Pneumococcal vaccine: up to date  Tdap vaccine: 2006 Shingles vaccine: postponed     Advanced directives: patient refused   Conditions/risks identified: advanced age, hypertension  Next appointment: Wellness visit in one year   Preventive Care 4565 Years and Older, Male Preventive care refers to lifestyle choices and visits with your health care provider that can promote health and wellness. What does preventive care include?  A yearly physical exam. This is also called an annual well check.  Dental exams once or twice a year.  Routine eye exams. Ask your health care provider how often you should have your eyes checked.  Personal lifestyle choices, including:  Daily care of your teeth and gums.  Regular physical activity.  Eating a healthy diet.  Avoiding tobacco and drug use.  Limiting alcohol use.  Practicing safe sex.  Taking low doses of aspirin every day.  Taking vitamin and mineral supplements as recommended by your health care provider. What happens during an annual well check? The services and screenings done by your health care provider during your annual well check will depend on your age, overall health, lifestyle risk factors, and family history of disease. Counseling  Your health care provider may ask you questions about your:  Alcohol use.  Tobacco use.  Drug use.  Emotional well-being.  Home and relationship well-being.  Sexual activity.  Eating  habits.  History of falls.  Memory and ability to understand (cognition).  Work and work Astronomerenvironment. Screening  You may have the following tests or measurements:  Height, weight, and BMI.  Blood pressure.  Lipid and cholesterol levels. These may be checked every 5 years, or more frequently if you are over 74 years old.  Skin check.  Lung cancer screening. You may have this screening every year starting at age 74 if you have a 30-pack-year history of smoking and currently smoke or have quit within the past 15 years.  Fecal occult blood test (FOBT) of the stool. You may have this test every year starting at age 74.  Flexible sigmoidoscopy or colonoscopy. You may have a sigmoidoscopy every 5 years or a colonoscopy every 10 years starting at age 450.  Prostate cancer screening. Recommendations will vary depending on your family history and other risks.  Hepatitis C blood test.  Hepatitis B blood test.  Sexually transmitted disease (STD) testing.  Diabetes screening. This is done by checking your blood sugar (glucose) after you have not eaten for a while (fasting). You may have this done every 1-3 years.  Abdominal aortic aneurysm (AAA) screening. You may need this if you are a current or former smoker.  Osteoporosis. You may be screened starting at age 74 if you are at high risk. Talk with your health care provider about your test results, treatment options, and if necessary, the need for more tests. Vaccines  Your health care provider may recommend certain vaccines, such as:  Influenza vaccine. This is recommended every year.  Tetanus, diphtheria, and acellular pertussis (Tdap, Td) vaccine. You may need a Td booster every 10  years.  Zoster vaccine. You may need this after age 94.  Pneumococcal 13-valent conjugate (PCV13) vaccine. One dose is recommended after age 76.  Pneumococcal polysaccharide (PPSV23) vaccine. One dose is recommended after age 64. Talk to your health  care provider about which screenings and vaccines you need and how often you need them. This information is not intended to replace advice given to you by your health care provider. Make sure you discuss any questions you have with your health care provider. Document Released: 08/18/2015 Document Revised: 04/10/2016 Document Reviewed: 05/23/2015 Elsevier Interactive Patient Education  2017 [place] Prevention in the Home Falls can cause injuries. They can happen to people of all ages. There are many things you can do to make your home safe and to help prevent falls. What can I do on the outside of my home?  Regularly fix the edges of walkways and driveways and fix any cracks.  Remove anything that might make you trip as you walk through a door, such as a raised step or threshold.  Trim any bushes or trees on the path to your home.  Use bright outdoor lighting.  Clear any walking paths of anything that might make someone trip, such as rocks or tools.  Regularly check to see if handrails are loose or broken. Make sure that both sides of any steps have handrails.  Any raised decks and porches should have guardrails on the edges.  Have any leaves, snow, or ice cleared regularly.  Use sand or salt on walking paths during winter.  Clean up any spills in your garage right away. This includes oil or grease spills. What can I do in the bathroom?  Use night lights.  Install grab bars by the toilet and in the tub and shower. Do not use towel bars as grab bars.  Use non-skid mats or decals in the tub or shower.  If you need to sit down in the shower, use a plastic, non-slip stool.  Keep the floor dry. Clean up any water that spills on the floor as soon as it happens.  Remove soap buildup in the tub or shower regularly.  Attach bath mats securely with double-sided non-slip rug tape.  Do not have throw rugs and other things on the floor that can make you trip. What can I do  in the bedroom?  Use night lights.  Make sure that you have a light by your bed that is easy to reach.  Do not use any sheets or blankets that are too big for your bed. They should not hang down onto the floor.  Have a firm chair that has side arms. You can use this for support while you get dressed.  Do not have throw rugs and other things on the floor that can make you trip. What can I do in the kitchen?  Clean up any spills right away.  Avoid walking on wet floors.  Keep items that you use a lot in easy-to-reach places.  If you need to reach something above you, use a strong step stool that has a grab bar.  Keep electrical cords out of the way.  Do not use floor polish or wax that makes floors slippery. If you must use wax, use non-skid floor wax.  Do not have throw rugs and other things on the floor that can make you trip. What can I do with my stairs?  Do not leave any items on the stairs.  Make sure that  there are handrails on both sides of the stairs and use them. Fix handrails that are broken or loose. Make sure that handrails are as long as the stairways.  Check any carpeting to make sure that it is firmly attached to the stairs. Fix any carpet that is loose or worn.  Avoid having throw rugs at the top or bottom of the stairs. If you do have throw rugs, attach them to the floor with carpet tape.  Make sure that you have a light switch at the top of the stairs and the bottom of the stairs. If you do not have them, ask someone to add them for you. What else can I do to help prevent falls?  Wear shoes that:  Do not have high heels.  Have rubber bottoms.  Are comfortable and fit you well.  Are closed at the toe. Do not wear sandals.  If you use a stepladder:  Make sure that it is fully opened. Do not climb a closed stepladder.  Make sure that both sides of the stepladder are locked into place.  Ask someone to hold it for you, if possible.  Clearly mark  and make sure that you can see:  Any grab bars or handrails.  First and last steps.  Where the edge of each step is.  Use tools that help you move around (mobility aids) if they are needed. These include:  Canes.  Walkers.  Scooters.  Crutches.  Turn on the lights when you go into a dark area. Replace any light bulbs as soon as they burn out.  Set up your furniture so you have a clear path. Avoid moving your furniture around.  If any of your floors are uneven, fix them.  If there are any pets around you, be aware of where they are.  Review your medicines with your doctor. Some medicines can make you feel dizzy. This can increase your chance of falling. Ask your doctor what other things that you can do to help prevent falls. This information is not intended to replace advice given to you by your health care provider. Make sure you discuss any questions you have with your health care provider. Document Released: 05/18/2009 Document Revised: 12/28/2015 Document Reviewed: 08/26/2014 Elsevier Interactive Patient Education  2017 [employer].

## 2018-07-07 NOTE — Progress Notes (Signed)
Subjective:   Bill Ward is a 72 y.o. male who presents for Medicare Annual/Subsequent preventive examination.  Review of Systems:   Cardiac Risk Factors include: advanced age (>37men, >28 women);hypertension;smoking/ tobacco exposure;male gender     Objective:    Vitals: BP (!) 144/69   Pulse 96   Resp 12   Ht 5\' 6"  (1.676 m)   Wt 188 lb (85.3 kg)   SpO2 97%   BMI 30.34 kg/m   Body mass index is 30.34 kg/m.  Advanced Directives 07/07/2018 06/30/2017 01/02/2017  Does Patient Have a Medical Advance Directive? No No No  Would patient like information on creating a medical advance directive? No - Patient declined No - Patient declined No - Patient declined    Tobacco Social History   Tobacco Use  Smoking Status Former Smoker  . Packs/day: 0.50  . Years: 35.00  . Pack years: 17.50  . Types: Cigarettes  Smokeless Tobacco Never Used     Counseling given: Not Answered   Clinical Intake:  Pre-visit preparation completed: Yes  Pain : No/denies pain Pain Score: 0-No pain     BMI - recorded: 30.3 Nutritional Status: BMI > 30  Obese Nutritional Risks: None Diabetes: No  How often do you need to have someone help you when you read instructions, pamphlets, or other written materials from your doctor or pharmacy?: 2 - Rarely What is the last grade level you completed in school?: 11 grade   Interpreter Needed?: No  Information entered by :: Hartford Poli LPN  Past Medical History:  Diagnosis Date  . Allergy   . Erectile dysfunction   . Essential hypertension, benign    Past Surgical History:  Procedure Laterality Date  . COLONOSCOPY    . COLONOSCOPY N/A 01/02/2017   Procedure: COLONOSCOPY;  Surgeon: Malissa Hippo, MD;  Location: [facility];  Service: Endoscopy;  Laterality: N/A;  930   Family History  Problem Relation Age of Onset  . Hypertension Mother   . Cancer Father        bone  . Diabetes Sister   . Hypertension Sister   . Hypertension  Brother   . Colon cancer Neg Hx    Social History   Socioeconomic History  . Marital status: Single    Spouse name: Not on file  . Number of children: 0  . Years of education: 17  . Highest education level: 11th grade  Occupational History  . Occupation: retired   Engineer, production  . Financial resource strain: Not hard at all  . Food insecurity:    Worry: Never true    Inability: Never true  . Transportation needs:    Medical: No    Non-medical: No  Tobacco Use  . Smoking status: Former Smoker    Packs/day: 0.50    Years: 35.00    Pack years: 17.50    Types: Cigarettes  . Smokeless tobacco: Never Used  Substance and Sexual Activity  . Alcohol use: No  . Drug use: No  . Sexual activity: Yes  Lifestyle  . Physical activity:    Days per week: 4 days    Minutes per session: 60 min  . Stress: Not at all  Relationships  . Social connections:    Talks on phone: Once a week    Gets together: More than three times a week    Attends religious service: Never    Active member of club or organization: No    Attends meetings  of clubs or organizations: Never    Relationship status: Never married  Other Topics Concern  . Not on file  Social History Narrative   Lives with sister and a brother     Outpatient Encounter Medications as of 07/07/2018  Medication Sig  . doxazosin (CARDURA) 4 MG tablet Take 1 tablet (4 mg total) by mouth daily.  Marland Kitchen lisinopril (PRINIVIL,ZESTRIL) 20 MG tablet Take 1 tablet (20 mg total) by mouth daily.  Marland Kitchen aspirin 81 MG tablet Take 81 mg by mouth daily.     No facility-administered encounter medications on file as of 07/07/2018.     Activities of Daily Living In your present state of health, do you have any difficulty performing the following activities: 07/07/2018  Hearing? N  Vision? N  Difficulty concentrating or making decisions? N  Walking or climbing stairs? N  Dressing or bathing? N  Doing errands, shopping? N  Preparing Food and eating ? N    Using the Toilet? N  In the past six months, have you accidently leaked urine? N  Do you have problems with loss of bowel control? N  Managing your Medications? N  Managing your Finances? N  Housekeeping or managing your Housekeeping? N  Some recent data might be hidden    Patient Care Team: Kerri Perches, MD as PCP - General   Assessment:   This is a routine wellness examination for Bill Ward.  Exercise Activities and Dietary recommendations Current Exercise Habits: Home exercise routine, Intensity: Mild  Goals    . DIET - INCREASE WATER INTAKE       Fall Risk Fall Risk  07/07/2018 03/03/2018 06/30/2017 04/16/2017 05/06/2016  Falls in the past year? 0 No No No No   Is the patient's home free of loose throw rugs in walkways, pet beds, electrical cords, etc?   no      Grab bars in the bathroom? yes      Handrails on the stairs?   yes      Adequate lighting?   yes  Timed Get Up and Go Performed: Patient able to perform in 5 seconds without assistance   Depression Screen PHQ 2/9 Scores 07/07/2018 03/03/2018 06/30/2017 04/16/2017  PHQ - 2 Score 0 0 0 0    Cognitive Function     6CIT Screen 07/07/2018 06/30/2017  What Year? 0 points 4 points  What month? 0 points 0 points  What time? 0 points 3 points  Count back from 20 4 points 4 points  Months in reverse 0 points 4 points  Repeat phrase 0 points 4 points  Total Score 4 19    Immunization History  Administered Date(s) Administered  . Influenza Split 07/09/2011, 05/07/2012  . Influenza Whole 05/14/2005, 06/19/2009, 06/26/2010  . Influenza,inj,Quad PF,6+ Mos 05/12/2013, 05/23/2014, 05/01/2015, 05/13/2016, 04/16/2017, 05/27/2018  . Pneumococcal Conjugate-13 03/22/2014  . Pneumococcal Polysaccharide-23 02/07/2009  . Td 11/08/2004    Qualifies for Shingles Vaccine? Postponed   Screening Tests Health Maintenance  Topic Date Due  . TETANUS/TDAP  03/04/2019 (Originally 11/09/2014)  . COLONOSCOPY  01/03/2027  .  INFLUENZA VACCINE  Completed  . Hepatitis C Screening  Completed  . PNA vac Low Risk Adult  Completed   Cancer Screenings: Lung: Low Dose CT Chest recommended if Age 16-80 years, 30 pack-year currently smoking OR have quit w/in 15years. Patient does not qualify. Colorectal: up to date   Additional Screenings:  Hepatitis C Screening:Complete       Plan:   Continue to exercise,  and increase water intake   I have personally reviewed and noted the following in the patient's chart:   . Medical and social history . Use of alcohol, tobacco or illicit drugs  . Current medications and supplements . Functional ability and status . Nutritional status . Physical activity . Advanced directives . List of other physicians . Hospitalizations, surgeries, and ER visits in previous 12 months . Vitals . Screenings to include cognitive, depression, and falls . Referrals and appointments  In addition, I have reviewed and discussed with patient certain preventive protocols, quality metrics, and best practice recommendations. A written personalized care plan for preventive services as well as general preventive health recommendations were provided to patient.     Donicia Druck J NFabian Novemberelson, LPN  [id number]/10/2017

## 2018-07-30 ENCOUNTER — Telehealth: Payer: Self-pay | Admitting: Family Medicine

## 2018-07-30 NOTE — Telephone Encounter (Signed)
Spoke with patient and advised him if he was in pain he needed to be evaluated by either urgent care or the ED because Dr.Simpson was unavailable until next week. He verbalized understanding.

## 2018-07-30 NOTE — Telephone Encounter (Signed)
Started yesterday pain  (discomfort) in the lower right abdominal, pt did not rest well couldn't get sleep from the discomfort. Advised Dr Lodema HongSimpson is unavailable til 12-30,

## 2018-10-05 ENCOUNTER — Encounter: Payer: Self-pay | Admitting: *Deleted

## 2018-10-26 ENCOUNTER — Encounter: Payer: Medicare Other | Admitting: Family Medicine

## 2018-10-27 ENCOUNTER — Encounter: Payer: Medicare Other | Admitting: Family Medicine

## 2018-11-11 ENCOUNTER — Encounter: Payer: Self-pay | Admitting: *Deleted

## 2018-11-12 ENCOUNTER — Encounter: Payer: Medicare Other | Admitting: Family Medicine

## 2018-12-08 ENCOUNTER — Encounter: Payer: Self-pay | Admitting: *Deleted

## 2019-02-16 ENCOUNTER — Encounter: Payer: Medicare Other | Admitting: Family Medicine

## 2019-03-16 ENCOUNTER — Other Ambulatory Visit: Payer: Self-pay | Admitting: Family Medicine

## 2019-03-22 ENCOUNTER — Other Ambulatory Visit: Payer: Self-pay

## 2019-03-22 ENCOUNTER — Telehealth: Payer: Self-pay | Admitting: *Deleted

## 2019-03-22 MED ORDER — LISINOPRIL 20 MG PO TABS: 20.0000 mg | ORAL_TABLET | Freq: Every day | ORAL | 0 refills | Status: DC

## 2019-03-22 MED ORDER — DOXAZOSIN MESYLATE 4 MG PO TABS: 4.0000 mg | ORAL_TABLET | Freq: Every day | ORAL | 0 refills | Status: DC

## 2019-03-22 NOTE — Telephone Encounter (Signed)
Pt came in the office today forgot his physical that was scheduled for 7-14. He rescheduled for 8-19 but he is running out of all of his medicaitons and needs [facility] only has a weeks worth

## 2019-03-22 NOTE — Telephone Encounter (Signed)
meds sent to [facility]

## 2019-03-24 ENCOUNTER — Other Ambulatory Visit: Payer: Self-pay

## 2019-03-24 ENCOUNTER — Ambulatory Visit (INDEPENDENT_AMBULATORY_CARE_PROVIDER_SITE_OTHER): Payer: Medicare Other | Admitting: Family Medicine

## 2019-03-24 ENCOUNTER — Encounter: Payer: Self-pay | Admitting: Family Medicine

## 2019-03-24 VITALS — BP 160/58 | HR 83 | Resp 12 | Ht 66.0 in | Wt 183.0 lb

## 2019-03-24 DIAGNOSIS — Z23 Encounter for immunization: Secondary | ICD-10-CM | POA: Diagnosis not present

## 2019-03-24 DIAGNOSIS — Z13220 Encounter for screening for lipoid disorders: Secondary | ICD-10-CM | POA: Diagnosis not present

## 2019-03-24 DIAGNOSIS — Z0000 Encounter for general adult medical examination without abnormal findings: Secondary | ICD-10-CM | POA: Diagnosis not present

## 2019-03-24 DIAGNOSIS — Z125 Encounter for screening for malignant neoplasm of prostate: Secondary | ICD-10-CM

## 2019-03-24 DIAGNOSIS — I10 Essential (primary) hypertension: Secondary | ICD-10-CM | POA: Diagnosis not present

## 2019-03-24 MED ORDER — DOXAZOSIN MESYLATE 4 MG PO TABS: 4.0000 mg | ORAL_TABLET | Freq: Every day | ORAL | 2 refills | Status: DC

## 2019-03-24 MED ORDER — LISINOPRIL-HYDROCHLOROTHIAZIDE 20-12.5 MG PO TABS: 1.0000 | ORAL_TABLET | Freq: Every day | ORAL | 3 refills | Status: DC

## 2019-03-24 MED ORDER — LISINOPRIL 20 MG PO TABS: 20.0000 mg | ORAL_TABLET | Freq: Every day | ORAL | 2 refills | Status: DC

## 2019-03-24 NOTE — Patient Instructions (Signed)
F/U in office for BP re eval with MD in 6 to 8 weeks, call if you need me sooner  Flu vaccine today  CBC, lipid, chem 7 and eGFR, PSA  Today  BP is too high, new medication is zestoretic 20/12.5 one  Daily, once you get this stop lisinopril 58m  Continue cardura 4 mg one at night  Thanks for choosing [facility], we consider it a privelige to serve you.

## 2019-03-24 NOTE — Progress Notes (Signed)
   Bill Ward     MRN: [id number]      DOB: 1946/03/12   HPI: Patient is in for annual physical exam. No other health concerns are expressed or addressed at the visit. Recent labs, if available are reviewed. Immunization is reviewed , and  updated if needed.    PE; BP (!) 160/58   Pulse 83   Resp 12   Ht 5\' 6"  (1.676 m)   Wt 183 lb 0.6 oz (83 kg)   SpO2 98%   BMI 29.54 kg/m   Pleasant male, alert and oriented x 3, in no cardio-pulmonary distress. Afebrile. HEENT No facial trauma or asymetry. Sinuses non tender. EOMI, pupils equally reactive to light. External ears normal, tympanic membranes clear. Oropharynx moist, no exudate. Neck: supple, no adenopathy,JVD or thyromegaly.No bruits.  Chest: Clear to ascultation bilaterally.No crackles or wheezes. Non tender to palpation  Cardiovascular system; Heart sounds normal,  S1 and  S2 ,no S3.  No murmur, or thrill. Apical beat not displaced Peripheral pulses normal.  Abdomen: Soft, non tender, no organomegaly or masses. No bruits. Bowel sounds normal. No guarding, tenderness or rebound.   Musculoskeletal exam: Full ROM of spine, hips , shoulders and knees. No deformity ,swelling or crepitus noted. No muscle wasting or atrophy.   Neurologic: Cranial nerves 2 to 12 intact. Power, tone ,sensation and reflexes normal throughout. No disturbance in gait. No tremor.  Skin: Intact, no ulceration, erythema , scaling or rash noted. Pigmentation normal throughout  Psych; Normal mood and affect. Judgement and concentration normal   Assessment & Plan:  Annual physical exam Annual exam as documented. Counseling done  re healthy lifestyle involving commitment to 150 minutes exercise per week, heart healthy diet, and attaining healthy weight.The importance of adequate sleep also discussed. Regular seat belt use and home safety, is also discussed. Changes in health habits are decided on by the patient with goals and  time frames  set for achieving them. Immunization and cancer screening needs are specifically addressed at this visit.   Need for immunization against influenza After obtaining informed consent, the vaccine is  administered , with no adverse effect noted at the time of administration.   Essential hypertension Uncontrolled, change from zestril to zestoretic, re eval in 6 to 8 weeks DASH diet and commitment to daily physical activity for a minimum of 30 minutes discussed and encouraged, as a part of hypertension management. The importance of attaining a healthy weight is also discussed.  BP/Weight 03/24/2019 07/07/2018 03/03/2018 09/03/2017 06/30/2017 05/14/2017 11/29/8339  Systolic BP 962 229 798 921 194 174 081  Diastolic BP 58 69 56 80 70 70 80  Wt. (Lbs) 183.04 188 185.12 184.12 190.25 - 184.75  BMI 29.54 30.34 26.56 29.72 30.71 - 29.82

## 2019-03-25 ENCOUNTER — Encounter: Payer: Self-pay | Admitting: Family Medicine

## 2019-03-25 LAB — LIPID PANEL
Cholesterol: 169 mg/dL (ref <200)
HDL: 61 mg/dL (ref >=40)
LDL Cholesterol (Calc): 86 mg/dL (calc)
Non-HDL Cholesterol (Calc): 108 mg/dL (calc) (ref <130)
Total CHOL/HDL Ratio: 2.8 (calc) (ref <5.0)
Triglycerides: 127 mg/dL (ref <150)

## 2019-03-25 LAB — BASIC METABOLIC PANEL WITH GFR
BUN: 19 mg/dL (ref 7–25)
CO2: 27 mmol/L (ref 20–32)
Calcium: 9.6 mg/dL (ref 8.6–10.3)
Chloride: 101 mmol/L (ref 98–110)
Creat: 1.13 mg/dL (ref 0.70–1.18)
GFR, Est African American: 73 mL/min/{1.73_m2} (ref >=60)
GFR, Est Non African American: 63 mL/min/{1.73_m2} (ref >=60)
Glucose, Bld: 111 mg/dL (ref 65–139)
Potassium: 5 mmol/L (ref 3.5–5.3)
Sodium: 136 mmol/L (ref 135–146)

## 2019-03-25 LAB — CBC
HCT: 38.1 % — ABNORMAL LOW (ref 38.5–50.0)
Hemoglobin: 12.1 g/dL — ABNORMAL LOW (ref 13.2–17.1)
MCH: 29.2 pg (ref 27.0–33.0)
MCHC: 31.8 g/dL — ABNORMAL LOW (ref 32.0–36.0)
MCV: 92 fL (ref 80.0–100.0)
MPV: 12.9 fL — ABNORMAL HIGH (ref 7.5–12.5)
Platelets: 149 10*3/uL (ref 140–400)
RBC: 4.14 10*6/uL — ABNORMAL LOW (ref 4.20–5.80)
RDW: 12.1 % (ref 11.0–15.0)
WBC: 5.9 10*3/uL (ref 3.8–10.8)

## 2019-03-25 LAB — PSA: PSA: 1.7 ng/mL (ref <=4.0)

## 2019-03-25 NOTE — Assessment & Plan Note (Signed)
After obtaining informed consent, the vaccine is  administered , with no adverse effect noted at the time of administration.  

## 2019-03-25 NOTE — Assessment & Plan Note (Signed)

## 2019-03-25 NOTE — Assessment & Plan Note (Signed)
Uncontrolled, change from zestril to zestoretic, re eval in 6 to 8 weeks DASH diet and commitment to daily physical activity for a minimum of 30 minutes discussed and encouraged, as a part of hypertension management. The importance of attaining a healthy weight is also discussed.  BP/Weight 03/24/2019 07/07/2018 03/03/2018 09/03/2017 06/30/2017 05/14/2017 9/44/4619  Systolic BP 012 224 114 643 142 767 011  Diastolic BP 58 69 56 80 70 70 80  Wt. (Lbs) 183.04 188 185.12 184.12 190.25 - 184.75  BMI 29.54 30.34 26.56 29.72 30.71 - 29.82

## 2019-05-25 ENCOUNTER — Ambulatory Visit (INDEPENDENT_AMBULATORY_CARE_PROVIDER_SITE_OTHER): Payer: Medicare Other | Admitting: Family Medicine

## 2019-05-25 ENCOUNTER — Encounter (INDEPENDENT_AMBULATORY_CARE_PROVIDER_SITE_OTHER): Payer: Self-pay

## 2019-05-25 ENCOUNTER — Other Ambulatory Visit: Payer: Self-pay

## 2019-05-25 ENCOUNTER — Encounter: Payer: Self-pay | Admitting: Family Medicine

## 2019-05-25 VITALS — BP 120/70 | HR 91 | Temp 98.3°F | Resp 15 | Ht 66.0 in | Wt 179.0 lb

## 2019-05-25 DIAGNOSIS — E663 Overweight: Secondary | ICD-10-CM

## 2019-05-25 DIAGNOSIS — I10 Essential (primary) hypertension: Secondary | ICD-10-CM

## 2019-05-25 DIAGNOSIS — J3089 Other allergic rhinitis: Secondary | ICD-10-CM

## 2019-05-25 NOTE — Assessment & Plan Note (Signed)
Controlled, no change in medication DASH diet and commitment to daily physical activity for a minimum of 30 minutes discussed and encouraged, as a part of hypertension management. The importance of attaining a healthy weight is also discussed.  BP/Weight 05/25/2019 03/24/2019 07/07/2018 03/03/2018 09/03/2017 06/30/2017 55/37/4827  Systolic BP 078 675 449 201 007 121 975  Diastolic BP 70 58 69 56 80 70 70  Wt. (Lbs) 179 183.04 188 185.12 184.12 190.25 -  BMI 28.89 29.54 30.34 26.56 29.72 30.71 -

## 2019-05-25 NOTE — Patient Instructions (Addendum)
Congrats on excellent blood pressure, continue current medication  Keep wellness as before  MD f/u early April, call if you need me before  CBC , and chem 7 non fasting last week in March  Keep healthy diet because this has helped both weight and BP  Thanks for choosing [facility], we consider it a privelige to serve you.

## 2019-05-27 ENCOUNTER — Encounter: Payer: Self-pay | Admitting: Family Medicine

## 2019-05-27 NOTE — Assessment & Plan Note (Signed)
Controlled , no current flare, on no medication currently

## 2019-05-27 NOTE — Assessment & Plan Note (Signed)
  Patient re-educated about  the importance of commitment to a  minimum of 150 minutes of exercise per week as able.  The importance of healthy food choices with portion control discussed, as well as eating regularly and within a 12 hour window most days. The need to choose "clean , green" food 50 to 75% of the time is discussed, as well as to make water the primary drink and set a goal of 64 ounces water daily.    Weight /BMI 05/25/2019 03/24/2019 07/07/2018  WEIGHT 179 lb 183 lb 0.6 oz 188 lb  HEIGHT 5\' 6"  5\' 6"  5\' 6"   BMI 28.89 kg/m2 29.54 kg/m2 30.34 kg/m2

## 2019-05-27 NOTE — Progress Notes (Signed)
   Bill Ward     MRN: [id number]      DOB: 1945-11-27   HPI Bill Ward is here for follow up and re-evaluation of chronic medical conditions, medication management and review of any available recent lab and radiology data.  Preventive health is updated, specifically  Cancer screening and Immunization.   Questions or concerns regarding consultations or procedures which the PT has had in the interim are  addressed. The PT denies any adverse reactions to current medications since the last visit.  There are no new concerns.  There are no specific complaints   ROS Denies recent fever or chills. Denies sinus pressure, nasal congestion, ear pain or sore throat. Denies chest congestion, productive cough or wheezing. Denies chest pains, palpitations and leg swelling Denies abdominal pain, nausea, vomiting,diarrhea or constipation.   Denies dysuria, frequency, hesitancy or incontinence. Denies joint pain, swelling and limitation in mobility. Denies headaches, seizures, numbness, or tingling. Denies depression, anxiety or insomnia. Denies skin break down or rash.   PE  BP 120/70   Pulse 91   Temp 98.3 F (36.8 C) (Temporal)   Resp 15   Ht 5\' 6"  (1.676 m)   Wt 179 lb (81.2 kg)   SpO2 97%   BMI 28.89 kg/m   Patient alert and oriented and in no cardiopulmonary distress.  HEENT: No facial asymmetry, EOMI,     Neck supple .  Chest: Clear to auscultation bilaterally.  CVS: S1, S2 no murmurs, no S3.Regular rate.  ABD: Soft non tender.   Ext: No edema  MS: Adequate ROM spine, shoulders, hips and knees.  Skin: Intact, no ulcerations or rash noted.  Psych: Good eye contact, normal affect. Memory intact not anxious or depressed appearing.  CNS: CN 2-12 intact, power,  normal throughout.no focal deficits noted.   Assessment & Plan  Essential hypertension Controlled, no change in medication DASH diet and commitment to daily physical activity for a minimum of 30 minutes  discussed and encouraged, as a part of hypertension management. The importance of attaining a healthy weight is also discussed.  BP/Weight 05/25/2019 03/24/2019 07/07/2018 03/03/2018 09/03/2017 06/30/2017 32/99/2426  Systolic BP 834 196 222 979 892 119 417  Diastolic BP 70 58 69 56 80 70 70  Wt. (Lbs) 179 183.04 188 185.12 184.12 190.25 -  BMI 28.89 29.54 30.34 26.56 29.72 30.71 -       Overweight (BMI 25.0-29.9)  Patient re-educated about  the importance of commitment to a  minimum of 150 minutes of exercise per week as able.  The importance of healthy food choices with portion control discussed, as well as eating regularly and within a 12 hour window most days. The need to choose "clean , green" food 50 to 75% of the time is discussed, as well as to make water the primary drink and set a goal of 64 ounces water daily.    Weight /BMI 05/25/2019 03/24/2019 07/07/2018  WEIGHT 179 lb 183 lb 0.6 oz 188 lb  HEIGHT 5\' 6"  5\' 6"  5\' 6"   BMI 28.89 kg/m2 29.54 kg/m2 30.34 kg/m2      Allergic rhinitis Controlled , no current flare, on no medication currently

## 2019-07-02 ENCOUNTER — Ambulatory Visit: Payer: Medicare Other | Admitting: Family Medicine

## 2019-07-09 ENCOUNTER — Encounter: Payer: Self-pay | Admitting: Family Medicine

## 2019-07-09 ENCOUNTER — Other Ambulatory Visit: Payer: Self-pay

## 2019-07-09 ENCOUNTER — Ambulatory Visit (INDEPENDENT_AMBULATORY_CARE_PROVIDER_SITE_OTHER): Payer: Medicare Other | Admitting: Family Medicine

## 2019-07-09 VITALS — BP 120/70 | HR 91 | Resp 15 | Ht 66.0 in | Wt 179.0 lb

## 2019-07-09 DIAGNOSIS — Z0000 Encounter for general adult medical examination without abnormal findings: Secondary | ICD-10-CM | POA: Diagnosis not present

## 2019-07-09 NOTE — Patient Instructions (Addendum)
Bill Ward , Thank you for taking time to come for your Medicare Wellness Visit. I appreciate your ongoing commitment to your health goals. Please review the following plan we discussed and let me know if I can assist you in the future.   Please continue to practice social distancing to keep you, your family, and our community safe.  If you must go out, please wear a Mask and practice good handwashing.  We hope you have a happy, safe, and healthy Holiday Season! See you in the New Year :)  Screening recommendations/referrals: Colonoscopy: Due 2028 Recommended yearly ophthalmology/optometry visit for glaucoma screening and checkup Recommended yearly dental visit for hygiene and checkup  Vaccinations:  Influenza vaccine: Completed, Due Fall 2021 Pneumococcal vaccine: up todate Tdap vaccine: Due 2021 Shingles vaccine: check coverage  Advanced directives: You reported you do not have this, we are happy to help with this, if you change your mind about getting one.   Conditions/risks identified: Falls  Next appointment: 11/09/2019   Preventive Care 65 Years and Older, Male Preventive care refers to lifestyle choices and visits with your health care provider that can promote health and wellness. What does preventive care include?  A yearly physical exam. This is also called an annual well check.  Dental exams once or twice a year.  Routine eye exams. Ask your health care provider how often you should have your eyes checked.  Personal lifestyle choices, including:  Daily care of your teeth and gums.  Regular physical activity.  Eating a healthy diet.  Avoiding tobacco and drug use.  Limiting alcohol use.  Practicing safe sex.  Taking low doses of aspirin every day.  Taking vitamin and mineral supplements as recommended by your health care provider. What happens during an annual well check? The services and screenings done by your health care provider during your annual  well check will depend on your age, overall health, lifestyle risk factors, and family history of disease. Counseling  Your health care provider may ask you questions about your:  Alcohol use.  Tobacco use.  Drug use.  Emotional well-being.  Home and relationship well-being.  Sexual activity.  Eating habits.  History of falls.  Memory and ability to understand (cognition).  Work and work Statistician. Screening  You may have the following tests or measurements:  Height, weight, and BMI.  Blood pressure.  Lipid and cholesterol levels. These may be checked every 5 years, or more frequently if you are over 2 years old.  Skin check.  Lung cancer screening. You may have this screening every year starting at age 4 if you have a 30-pack-year history of smoking and currently smoke or have quit within the past 15 years.  Fecal occult blood test (FOBT) of the stool. You may have this test every year starting at age 89.  Flexible sigmoidoscopy or colonoscopy. You may have a sigmoidoscopy every 5 years or a colonoscopy every 10 years starting at age 6.  Prostate cancer screening. Recommendations will vary depending on your family history and other risks.  Hepatitis C blood test.  Hepatitis B blood test.  Sexually transmitted disease (STD) testing.  Diabetes screening. This is done by checking your blood sugar (glucose) after you have not eaten for a while (fasting). You may have this done every 1-3 years.  Abdominal aortic aneurysm (AAA) screening. You may need this if you are a current or former smoker.  Osteoporosis. You may be screened starting at age 72 if you are at  high risk. Talk with your health care provider about your test results, treatment options, and if necessary, the need for more tests. Vaccines  Your health care provider may recommend certain vaccines, such as:  Influenza vaccine. This is recommended every year.  Tetanus, diphtheria, and acellular  pertussis (Tdap, Td) vaccine. You may need a Td booster every 10 years.  Zoster vaccine. You may need this after age 53.  Pneumococcal 13-valent conjugate (PCV13) vaccine. One dose is recommended after age 18.  Pneumococcal polysaccharide (PPSV23) vaccine. One dose is recommended after age 41. Talk to your health care provider about which screenings and vaccines you need and how often you need them. This information is not intended to replace advice given to you by your health care provider. Make sure you discuss any questions you have with your health care provider. Document Released: 08/18/2015 Document Revised: 04/10/2016 Document Reviewed: 05/23/2015 Elsevier Interactive Patient Education  2017 [place] Prevention in the Home Falls can cause injuries. They can happen to people of all ages. There are many things you can do to make your home safe and to help prevent falls. What can I do on the outside of my home?  Regularly fix the edges of walkways and driveways and fix any cracks.  Remove anything that might make you trip as you walk through a door, such as a raised step or threshold.  Trim any bushes or trees on the path to your home.  Use bright outdoor lighting.  Clear any walking paths of anything that might make someone trip, such as rocks or tools.  Regularly check to see if handrails are loose or broken. Make sure that both sides of any steps have handrails.  Any raised decks and porches should have guardrails on the edges.  Have any leaves, snow, or ice cleared regularly.  Use sand or salt on walking paths during winter.  Clean up any spills in your garage right away. This includes oil or grease spills. What can I do in the bathroom?  Use night lights.  Install grab bars by the toilet and in the tub and shower. Do not use towel bars as grab bars.  Use non-skid mats or decals in the tub or shower.  If you need to sit down in the shower, use a plastic,  non-slip stool.  Keep the floor dry. Clean up any water that spills on the floor as soon as it happens.  Remove soap buildup in the tub or shower regularly.  Attach bath mats securely with double-sided non-slip rug tape.  Do not have throw rugs and other things on the floor that can make you trip. What can I do in the bedroom?  Use night lights.  Make sure that you have a light by your bed that is easy to reach.  Do not use any sheets or blankets that are too big for your bed. They should not hang down onto the floor.  Have a firm chair that has side arms. You can use this for support while you get dressed.  Do not have throw rugs and other things on the floor that can make you trip. What can I do in the kitchen?  Clean up any spills right away.  Avoid walking on wet floors.  Keep items that you use a lot in easy-to-reach places.  If you need to reach something above you, use a strong step stool that has a grab bar.  Keep electrical cords out of the  way.  Do not use floor polish or wax that makes floors slippery. If you must use wax, use non-skid floor wax.  Do not have throw rugs and other things on the floor that can make you trip. What can I do with my stairs?  Do not leave any items on the stairs.  Make sure that there are handrails on both sides of the stairs and use them. Fix handrails that are broken or loose. Make sure that handrails are as long as the stairways.  Check any carpeting to make sure that it is firmly attached to the stairs. Fix any carpet that is loose or worn.  Avoid having throw rugs at the top or bottom of the stairs. If you do have throw rugs, attach them to the floor with carpet tape.  Make sure that you have a light switch at the top of the stairs and the bottom of the stairs. If you do not have them, ask someone to add them for you. What else can I do to help prevent falls?  Wear shoes that:  Do not have high heels.  Have rubber  bottoms.  Are comfortable and fit you well.  Are closed at the toe. Do not wear sandals.  If you use a stepladder:  Make sure that it is fully opened. Do not climb a closed stepladder.  Make sure that both sides of the stepladder are locked into place.  Ask someone to hold it for you, if possible.  Clearly mark and make sure that you can see:  Any grab bars or handrails.  First and last steps.  Where the edge of each step is.  Use tools that help you move around (mobility aids) if they are needed. These include:  Canes.  Walkers.  Scooters.  Crutches.  Turn on the lights when you go into a dark area. Replace any light bulbs as soon as they burn out.  Set up your furniture so you have a clear path. Avoid moving your furniture around.  If any of your floors are uneven, fix them.  If there are any pets around you, be aware of where they are.  Review your medicines with your doctor. Some medicines can make you feel dizzy. This can increase your chance of falling. Ask your doctor what other things that you can do to help prevent falls. This information is not intended to replace advice given to you by your health care provider. Make sure you discuss any questions you have with your health care provider. Document Released: 05/18/2009 Document Revised: 12/28/2015 Document Reviewed: 08/26/2014 Elsevier Interactive Patient Education  2017 [employer].

## 2019-07-09 NOTE — Progress Notes (Signed)
Subjective:   Annett FabianJake J Bega is a 73 y.o. male who presents for Medicare Annual/Subsequent preventive examination.  Location of Patient: Home Location of Provider: Telehealth Consent was obtain for visit to be over via telehealth. I verified that I am speaking with the correct person using two identifiers.   Review of Systems:    Cardiac Risk Factors include: advanced age (>7255men, 57>65 women);male gender;hypertension     Objective:    Vitals: BP 120/70   Pulse 91   Resp 15   Ht 5\' 6"  (1.676 m)   Wt 179 lb (81.2 kg)   BMI 28.89 kg/m   Body mass index is 28.89 kg/m.  Advanced Directives 07/07/2018 06/30/2017 01/02/2017  Does Patient Have a Medical Advance Directive? No No No  Would patient like information on creating a medical advance directive? No - Patient declined No - Patient declined No - Patient declined    Tobacco Social History   Tobacco Use  Smoking Status Former Smoker  . Packs/day: 0.50  . Years: 35.00  . Pack years: 17.50  . Types: Cigarettes  Smokeless Tobacco Never Used     Counseling given: Yes   Clinical Intake:  Pre-visit preparation completed: Yes  Pain : No/denies pain Pain Score: 0-No pain     BMI - recorded: 28.89 Nutritional Status: BMI 25 -29 Overweight Nutritional Risks: None Diabetes: No  How often do you need to have someone help you when you read instructions, pamphlets, or other written materials from your doctor or pharmacy?: 1 - Never What is the last grade level you completed in school?: 11  Interpreter Needed?: No     Past Medical History:  Diagnosis Date  . Allergy   . Erectile dysfunction   . Essential hypertension, benign    Past Surgical History:  Procedure Laterality Date  . COLONOSCOPY    . COLONOSCOPY N/A 01/02/2017   Procedure: COLONOSCOPY;  Surgeon: Malissa Hippoehman, Najeeb U, MD;  Location: [facility];  Service: Endoscopy;  Laterality: N/A;  930   Family History  Problem Relation Age of Onset  .  Hypertension Mother   . Cancer Father        bone  . Diabetes Sister   . Hypertension Sister   . Hypertension Brother   . Colon cancer Neg Hx    Social History   Socioeconomic History  . Marital status: Single    Spouse name: Not on file  . Number of children: 0  . Years of education: 6011  . Highest education level: 11th grade  Occupational History  . Occupation: retired   Engineer, productionocial Needs  . Financial resource strain: Not hard at all  . Food insecurity    Worry: Never true    Inability: Never true  . Transportation needs    Medical: No    Non-medical: No  Tobacco Use  . Smoking status: Former Smoker    Packs/day: 0.50    Years: 35.00    Pack years: 17.50    Types: Cigarettes  . Smokeless tobacco: Never Used  Substance and Sexual Activity  . Alcohol use: No  . Drug use: No  . Sexual activity: Yes  Lifestyle  . Physical activity    Days per week: 4 days    Minutes per session: 60 min  . Stress: Not at all  Relationships  . Social connections    Talks on phone: Once a week    Gets together: More than three times a week    Attends  religious service: Never    Active member of club or organization: No    Attends meetings of clubs or organizations: Never    Relationship status: Never married  Other Topics Concern  . Not on file  Social History Narrative   Lives with sister and a brother     Outpatient Encounter Medications as of 07/09/2019  Medication Sig  . doxazosin (CARDURA) 4 MG tablet Take 1 tablet (4 mg total) by mouth daily.  Marland Kitchen lisinopril-hydrochlorothiazide (ZESTORETIC) 20-12.5 MG tablet Take 1 tablet by mouth daily.   No facility-administered encounter medications on file as of 07/09/2019.     Activities of Daily Living In your present state of health, do you have any difficulty performing the following activities: 07/09/2019  Hearing? N  Vision? N  Difficulty concentrating or making decisions? N  Walking or climbing stairs? N  Dressing or bathing? N   Doing errands, shopping? N  Preparing Food and eating ? N  Using the Toilet? N  In the past six months, have you accidently leaked urine? N  Do you have problems with loss of bowel control? N  Managing your Medications? N  Managing your Finances? N  Housekeeping or managing your Housekeeping? N  Some recent data might be hidden    Patient Care Team: Kerri Perches, MD as PCP - General   Assessment:   This is a routine wellness examination for Davey.  Exercise Activities and Dietary recommendations Current Exercise Habits: Home exercise routine, Type of exercise: walking, Time (Minutes): 60, Frequency (Times/Week): 7, Weekly Exercise (Minutes/Week): 420, Intensity: Moderate, Exercise limited by: None identified  Goals    . DIET - INCREASE WATER INTAKE       Fall Risk Fall Risk  07/09/2019 05/25/2019 03/24/2019 07/07/2018 03/03/2018  Falls in the past year? 0 0 0 0 No  Number falls in past yr: 0 0 0 - -  Injury with Fall? 0 0 0 - -   Is the patient's home free of loose throw rugs in walkways, pet beds, electrical cords, etc?   yes      Grab bars in the bathroom? yes      Handrails on the stairs?   yes      Adequate lighting?   yes     Depression Screen PHQ 2/9 Scores 07/09/2019 03/24/2019 07/07/2018 03/03/2018  PHQ - 2 Score 0 0 0 0    Cognitive Function     6CIT Screen 07/09/2019 07/07/2018 06/30/2017  What Year? 0 points 0 points 4 points  What month? 0 points 0 points 0 points  What time? 0 points 0 points 3 points  Count back from 20 0 points 4 points 4 points  Months in reverse 4 points 0 points 4 points  Repeat phrase 10 points 0 points 4 points  Total Score 14 4 19     Immunization History  Administered Date(s) Administered  . Fluad Quad(high Dose 65+) 03/24/2019  . Influenza Split 07/09/2011, 05/07/2012  . Influenza Whole 05/14/2005, 06/19/2009, 06/26/2010  . Influenza,inj,Quad PF,6+ Mos 05/12/2013, 05/23/2014, 05/01/2015, 05/13/2016, 04/16/2017, 05/27/2018   . Pneumococcal Conjugate-13 03/22/2014  . Pneumococcal Polysaccharide-23 02/07/2009  . Td 11/08/2004    Qualifies for Shingles Vaccine?  Needs to check coverage   Screening Tests Health Maintenance  Topic Date Due  . TETANUS/TDAP  03/23/2020 (Originally 11/09/2014)  . COLONOSCOPY  01/03/2027  . INFLUENZA VACCINE  Completed  . Hepatitis C Screening  Completed  . PNA vac Low Risk Adult  Completed  Cancer Screenings: Lung: Low Dose CT Chest recommended if Age 18-80 years, 30 pack-year currently smoking OR have quit w/in 15years. Patient does not qualify. Colorectal:  Due 2028  Additional Screenings:   Hepatitis C Screening: completed       Plan:       1. Encounter for Medicare annual wellness exam   I have personally reviewed and noted the following in the patient's chart:   . Medical and social history . Use of alcohol, tobacco or illicit drugs  . Current medications and supplements . Functional ability and status . Nutritional status . Physical activity . Advanced directives . List of other physicians . Hospitalizations, surgeries, and ER visits in previous 12 months . Vitals . Screenings to include cognitive, depression, and falls . Referrals and appointments  In addition, I have reviewed and discussed with patient certain preventive protocols, quality metrics, and best practice recommendations. A written personalized care plan for preventive services as well as general preventive health recommendations were provided to patient.     I provided 20 minutes of non-face-to-face time during this encounter.   Perlie Mayo, NP  07/09/2019

## 2019-07-12 ENCOUNTER — Ambulatory Visit: Payer: Medicare Other

## 2019-11-01 DIAGNOSIS — D539 Nutritional anemia, unspecified: Secondary | ICD-10-CM | POA: Diagnosis not present

## 2019-11-01 DIAGNOSIS — I10 Essential (primary) hypertension: Secondary | ICD-10-CM | POA: Diagnosis not present

## 2019-11-06 LAB — CBC
HCT: 34.9 % — ABNORMAL LOW (ref 38.5–50.0)
Hemoglobin: 11.2 g/dL — ABNORMAL LOW (ref 13.2–17.1)
MCH: 29.6 pg (ref 27.0–33.0)
MCHC: 32.1 g/dL (ref 32.0–36.0)
MCV: 92.3 fL (ref 80.0–100.0)
MPV: 12.7 fL — ABNORMAL HIGH (ref 7.5–12.5)
Platelets: 186 10*3/uL (ref 140–400)
RBC: 3.78 10*6/uL — ABNORMAL LOW (ref 4.20–5.80)
RDW: 11.7 % (ref 11.0–15.0)
WBC: 5 10*3/uL (ref 3.8–10.8)

## 2019-11-06 LAB — BASIC METABOLIC PANEL WITH GFR
BUN/Creatinine Ratio: 20 (calc) (ref 6–22)
BUN: 26 mg/dL — ABNORMAL HIGH (ref 7–25)
CO2: 29 mmol/L (ref 20–32)
Calcium: 9.5 mg/dL (ref 8.6–10.3)
Chloride: 103 mmol/L (ref 98–110)
Creat: 1.27 mg/dL — ABNORMAL HIGH (ref 0.70–1.18)
GFR, Est African American: 64 mL/min/{1.73_m2} (ref >=60)
GFR, Est Non African American: 55 mL/min/{1.73_m2} — ABNORMAL LOW (ref >=60)
Glucose, Bld: 104 mg/dL — ABNORMAL HIGH (ref 65–99)
Potassium: 4.9 mmol/L (ref 3.5–5.3)
Sodium: 138 mmol/L (ref 135–146)

## 2019-11-06 LAB — TEST AUTHORIZATION

## 2019-11-06 LAB — IRON: Iron: 104 ug/dL (ref 50–180)

## 2019-11-06 LAB — FERRITIN: Ferritin: 571 ng/mL — ABNORMAL HIGH (ref 24–380)

## 2019-11-09 ENCOUNTER — Ambulatory Visit: Payer: Medicare Other | Admitting: Family Medicine

## 2019-12-06 ENCOUNTER — Other Ambulatory Visit: Payer: Self-pay | Admitting: Family Medicine

## 2020-02-28 ENCOUNTER — Other Ambulatory Visit: Payer: Self-pay | Admitting: Family Medicine

## 2020-03-07 ENCOUNTER — Telehealth: Payer: Self-pay

## 2020-03-07 ENCOUNTER — Other Ambulatory Visit: Payer: Self-pay

## 2020-03-07 MED ORDER — DOXAZOSIN MESYLATE 4 MG PO TABS: 4.0000 mg | ORAL_TABLET | Freq: Every day | ORAL | 1 refills | Status: DC

## 2020-03-07 MED ORDER — LISINOPRIL-HYDROCHLOROTHIAZIDE 20-12.5 MG PO TABS: 1.0000 | ORAL_TABLET | Freq: Every day | ORAL | 1 refills | Status: DC

## 2020-03-07 NOTE — Telephone Encounter (Signed)
Please call in BP Lisinopril & Doxazosin

## 2020-03-07 NOTE — Telephone Encounter (Signed)
Meds refilled.

## 2020-03-09 ENCOUNTER — Encounter: Payer: Medicare Other | Admitting: Family Medicine

## 2020-03-27 ENCOUNTER — Other Ambulatory Visit: Payer: Self-pay

## 2020-03-27 ENCOUNTER — Ambulatory Visit (INDEPENDENT_AMBULATORY_CARE_PROVIDER_SITE_OTHER): Payer: Medicare Other | Admitting: Family Medicine

## 2020-03-27 ENCOUNTER — Encounter: Payer: Self-pay | Admitting: Family Medicine

## 2020-03-27 VITALS — BP 132/74 | HR 73 | Temp 97.7°F | Ht 70.0 in | Wt 184.1 lb

## 2020-03-27 DIAGNOSIS — Z125 Encounter for screening for malignant neoplasm of prostate: Secondary | ICD-10-CM

## 2020-03-27 DIAGNOSIS — I10 Essential (primary) hypertension: Secondary | ICD-10-CM | POA: Diagnosis not present

## 2020-03-27 DIAGNOSIS — H547 Unspecified visual loss: Secondary | ICD-10-CM | POA: Insufficient documentation

## 2020-03-27 DIAGNOSIS — Z0000 Encounter for general adult medical examination without abnormal findings: Secondary | ICD-10-CM | POA: Diagnosis not present

## 2020-03-27 DIAGNOSIS — Z23 Encounter for immunization: Secondary | ICD-10-CM

## 2020-03-27 DIAGNOSIS — E663 Overweight: Secondary | ICD-10-CM

## 2020-03-27 NOTE — Assessment & Plan Note (Signed)
After obtaining informed consent, the vaccine is  administered , with no adverse effect noted at the time of administration.  

## 2020-03-27 NOTE — Patient Instructions (Signed)
F/U in office in 6.5 months, with mD, call if you need me sooner  Chem 7 and EGFr and pSA today  Flu vaccine today  Nurse please try to obtain and document covid vaccine administered at [facility] in pt's chart  No med changes  Keep up the great health habits  You are referred for eye exam as vision ios not good even with your current glasses  It is important that you exercise regularly at least 30 minutes 5 times a week. If you develop chest pain, have severe difficulty breathing, or feel very tired, stop exercising immediately and seek medical attention  Think about what you will eat, plan ahead. Choose " clean, green, fresh or frozen" over canned, processed or packaged foods which are more sugary, salty and fatty. 70 to 75% of food eaten should be vegetables and fruit. Three meals at set times with snacks allowed between meals, but they must be fruit or vegetables. Aim to eat over a 12 hour period , example 7 am to 7 pm, and STOP after  your last meal of the day. Drink water,generally about 64 ounces per day, no other drink is as healthy. Fruit juice is best enjoyed in a healthy way, by EATING the fruit. Thanks for choosing [facility], we consider it a privelige to serve you.

## 2020-03-27 NOTE — Assessment & Plan Note (Signed)
Refer to optometry ?

## 2020-03-27 NOTE — Assessment & Plan Note (Signed)
  Patient re-educated about  the importance of commitment to a  minimum of 150 minutes of exercise per week as able.  The importance of healthy food choices with portion control discussed, as well as eating regularly and within a 12 hour window most days. The need to choose "clean , green" food 50 to 75% of the time is discussed, as well as to make water the primary drink and set a goal of 64 ounces water daily.    Weight /BMI 03/27/2020 07/09/2019 05/25/2019  WEIGHT 184 lb 1.9 oz 179 lb 179 lb  HEIGHT 5\' 10"  5\' 6"  5\' 6"   BMI 26.42 kg/m2 28.89 kg/m2 28.89 kg/m2

## 2020-03-27 NOTE — Progress Notes (Signed)
   Bill Ward     MRN: [id number]      DOB: 10-01-1945   HPI: Patient is in for annual physical exam. No other health concerns are expressed or addressed at the visit. Recent labs, if available are reviewed. Immunization is reviewed , and  updated if needed.    PE;  BP 132/74 (BP Location: Left Arm, Patient Position: Sitting, Cuff Size: Normal)   Pulse 73   Temp 97.7 F (36.5 C) (Oral)   Ht 5\' 10"  (1.778 m)   Wt 184 lb 1.9 oz (83.5 kg)   SpO2 98%   BMI 26.42 kg/m   Pleasant male, alert and oriented x 3, in no cardio-pulmonary distress. Afebrile. HEENT No facial trauma or asymetry. Sinuses non tender. EOMI External ears normal,  Neck: supple, no adenopathy,JVD or thyromegaly.No bruits.  Chest: Clear to ascultation bilaterally.No crackles or wheezes. Non tender to palpation  Cardiovascular system; Heart sounds normal,  S1 and  S2 ,no S3.  No murmur, or thrill. Apical beat not displaced Peripheral pulses normal.  Abdomen: Soft, non tender, no organomegaly or masses. No bruits. Bowel sounds normal. No guarding, tenderness or rebound.    Musculoskeletal exam: Full ROM of spine, hips , shoulders and knees. No deformity ,swelling or crepitus noted. No muscle wasting or atrophy.   Neurologic: Cranial nerves 2 to 12 intact. Power, tone ,sensation and reflexes normal throughout. No disturbance in gait. No tremor.  Skin: Intact, no ulceration, erythema , scaling or rash noted. Pigmentation normal throughout  Psych; Normal mood and affect. Judgement and concentration normal   Assessment & Plan:  Annual physical exam Annual exam as documented. Counseling done  re healthy lifestyle involving commitment to 150 minutes exercise per week, heart healthy diet, and attaining healthy weight.The importance of adequate sleep also discussed. Regular seat belt use and home safety, is also discussed. Changes in health habits are decided on by the patient with goals and  time frames  set for achieving them. Immunization and cancer screening needs are specifically addressed at this visit.   Need for influenza vaccination After obtaining informed consent, the vaccine is  administered , with no adverse effect noted at the time of administration.   Overweight (BMI 25.0-29.9)  Patient re-educated about  the importance of commitment to a  minimum of 150 minutes of exercise per week as able.  The importance of healthy food choices with portion control discussed, as well as eating regularly and within a 12 hour window most days. The need to choose "clean , green" food 50 to 75% of the time is discussed, as well as to make water the primary drink and set a goal of 64 ounces water daily.    Weight /BMI 03/27/2020 07/09/2019 05/25/2019  WEIGHT 184 lb 1.9 oz 179 lb 179 lb  HEIGHT 5\' 10"  5\' 6"  5\' 6"   BMI 26.42 kg/m2 28.89 kg/m2 28.89 kg/m2      Reduced vision Refer to optometry

## 2020-03-27 NOTE — Assessment & Plan Note (Signed)

## 2020-03-28 ENCOUNTER — Other Ambulatory Visit: Payer: Self-pay | Admitting: Family Medicine

## 2020-03-28 LAB — BASIC METABOLIC PANEL WITH GFR
BUN/Creatinine Ratio: 24 (calc) — ABNORMAL HIGH (ref 6–22)
BUN: 36 mg/dL — ABNORMAL HIGH (ref 7–25)
CO2: 26 mmol/L (ref 20–32)
Calcium: 10 mg/dL (ref 8.6–10.3)
Chloride: 102 mmol/L (ref 98–110)
Creat: 1.52 mg/dL — ABNORMAL HIGH (ref 0.70–1.18)
GFR, Est African American: 51 mL/min/{1.73_m2} — ABNORMAL LOW (ref >=60)
GFR, Est Non African American: 44 mL/min/{1.73_m2} — ABNORMAL LOW (ref >=60)
Glucose, Bld: 103 mg/dL (ref 65–139)
Potassium: 6.5 mmol/L (ref 3.5–5.3)
Sodium: 134 mmol/L — ABNORMAL LOW (ref 135–146)

## 2020-03-28 LAB — PSA: PSA: 2.4 ng/mL (ref <=4.0)

## 2020-03-28 MED ORDER — SODIUM POLYSTYRENE SULFONATE PO POWD
Freq: Once | ORAL | 0 refills | Status: AC
Start: 2020-03-28 — End: 2020-03-28

## 2020-03-28 NOTE — Progress Notes (Signed)
Kayexalate

## 2020-03-29 ENCOUNTER — Telehealth: Payer: Self-pay

## 2020-03-29 ENCOUNTER — Other Ambulatory Visit: Payer: Self-pay

## 2020-03-29 ENCOUNTER — Other Ambulatory Visit: Payer: Self-pay | Admitting: Family Medicine

## 2020-03-29 DIAGNOSIS — E875 Hyperkalemia: Secondary | ICD-10-CM

## 2020-03-29 MED ORDER — SODIUM POLYSTYRENE SULFONATE 15 GM/60ML PO SUSP: ORAL | 0 refills | Status: DC

## 2020-03-29 NOTE — Telephone Encounter (Signed)
Please confirm that this is correct --that this should be a Powder.  Please call the patient back and confirm. He was thinking it should be a Pill

## 2020-03-29 NOTE — Progress Notes (Signed)
kayex 

## 2020-03-29 NOTE — Telephone Encounter (Signed)
If you look it was prescyribedo n 8/24 one time only, pls call in the script or print and fax it and let pt know, thanks

## 2020-03-29 NOTE — Telephone Encounter (Signed)
Provider changed prescription to liquid. Patient aware prescription faxed to [facility]. Stated he will pick up tomorrow.

## 2020-03-29 NOTE — Telephone Encounter (Signed)
Pt is talking about kayxelate however he stated that he went to [facility] and this was not there. I did not see where this was sent in either. Please advise

## 2020-04-03 ENCOUNTER — Other Ambulatory Visit: Payer: Self-pay

## 2020-04-03 ENCOUNTER — Other Ambulatory Visit: Payer: Self-pay | Admitting: Family Medicine

## 2020-04-03 DIAGNOSIS — E875 Hyperkalemia: Secondary | ICD-10-CM | POA: Diagnosis not present

## 2020-04-03 LAB — BASIC METABOLIC PANEL WITH GFR
BUN/Creatinine Ratio: 23 (calc) — ABNORMAL HIGH (ref 6–22)
BUN: 36 mg/dL — ABNORMAL HIGH (ref 7–25)
CO2: 25 mmol/L (ref 20–32)
Calcium: 9.7 mg/dL (ref 8.6–10.3)
Chloride: 101 mmol/L (ref 98–110)
Creat: 1.56 mg/dL — ABNORMAL HIGH (ref 0.70–1.18)
GFR, Est African American: 49 mL/min/{1.73_m2} — ABNORMAL LOW (ref >=60)
GFR, Est Non African American: 43 mL/min/{1.73_m2} — ABNORMAL LOW (ref >=60)
Glucose, Bld: 100 mg/dL — ABNORMAL HIGH (ref 65–99)
Potassium: 5.7 mmol/L — ABNORMAL HIGH (ref 3.5–5.3)
Sodium: 132 mmol/L — ABNORMAL LOW (ref 135–146)

## 2020-04-03 MED ORDER — AMLODIPINE BESYLATE 5 MG PO TABS: 5.0000 mg | ORAL_TABLET | Freq: Every day | ORAL | 3 refills | Status: DC

## 2020-04-03 MED ORDER — SODIUM POLYSTYRENE SULFONATE 15 GM/60ML PO SUSP
ORAL | 0 refills | Status: DC
Start: 2020-04-03 — End: 2020-05-10

## 2020-04-19 ENCOUNTER — Ambulatory Visit: Payer: Medicare Other | Admitting: Family Medicine

## 2020-05-10 ENCOUNTER — Ambulatory Visit (INDEPENDENT_AMBULATORY_CARE_PROVIDER_SITE_OTHER): Payer: Medicare Other | Admitting: Family Medicine

## 2020-05-10 ENCOUNTER — Other Ambulatory Visit: Payer: Self-pay

## 2020-05-10 ENCOUNTER — Encounter: Payer: Self-pay | Admitting: Family Medicine

## 2020-05-10 VITALS — BP 172/90 | HR 99 | Ht 70.0 in | Wt 188.0 lb

## 2020-05-10 DIAGNOSIS — E663 Overweight: Secondary | ICD-10-CM

## 2020-05-10 DIAGNOSIS — I10 Essential (primary) hypertension: Secondary | ICD-10-CM

## 2020-05-10 MED ORDER — AMLODIPINE BESYLATE 10 MG PO TABS: 10.0000 mg | ORAL_TABLET | Freq: Every day | ORAL | 3 refills | Status: DC

## 2020-05-10 NOTE — Progress Notes (Signed)
   Bill Ward     MRN: [id number]      DOB: 04-28-1946   HPI Bill Ward is here for follow up and re-evaluation of chronic medical conditions, medication management and review of any available recent lab and radiology data.  Preventive health is updated, specifically  Cancer screening and Immunization.   Questions or concerns regarding consultations or procedures which the PT has had in the interim are  addressed. The PT denies any adverse reactions to current medications since the last visit.  There are no new concerns.  There are no specific complaints   ROS Denies recent fever or chills. Denies sinus pressure, nasal congestion, ear pain or sore throat. Denies chest congestion, productive cough or wheezing. Denies chest pains, palpitations and leg swelling Denies abdominal pain, nausea, vomiting,diarrhea or constipation.   Denies dysuria, frequency, hesitancy or incontinence. Denies joint pain, swelling and limitation in mobility. Denies headaches, seizures, numbness, or tingling. Denies depression, anxiety or insomnia. Denies skin break down or rash.   PE  BP (!) 172/90 (BP Location: Right Arm, Patient Position: Sitting, Cuff Size: Normal)   Pulse 99   Ht 5\' 10"  (1.778 m)   Wt 188 lb 0.6 oz (85.3 kg)   SpO2 96%   BMI 26.98 kg/m   Patient alert and oriented and in no cardiopulmonary distress.  HEENT: No facial asymmetry, EOMI,     Neck supple .  Chest: Clear to auscultation bilaterally.  CVS: S1, S2 no murmurs, no S3.Regular rate.  ABD: Soft non tender.   Ext: No edema  MS: Adequate ROM spine, shoulders, hips and knees.  Skin: Intact, no ulcerations or rash noted.  Psych: Good eye contact, normal affect. Memory intact not anxious or depressed appearing.  CNS: CN 2-12 intact, power,  normal throughout.no focal deficits noted.   Assessment & Plan Essential hypertension Uncontrolled, increase amlodipine dose to 10 mg daily DASH diet and commitment to  daily physical activity for a minimum of 30 minutes discussed and encouraged, as a part of hypertension management. The importance of attaining a healthy weight is also discussed.  BP/Weight 05/10/2020 03/27/2020 07/09/2019 05/25/2019 03/24/2019 07/07/2018 03/03/2018  Systolic BP 172 132 120 120 160 144 130  Diastolic BP 90 74 70 70 58 69 56  Wt. (Lbs) 188.04 184.12 179 179 183.04 188 185.12  BMI 26.98 26.42 28.89 28.89 29.54 30.34 26.56       Overweight (BMI 25.0-29.9)  Patient re-educated about  the importance of commitment to a  minimum of 150 minutes of exercise per week as able.  The importance of healthy food choices with portion control discussed, as well as eating regularly and within a 12 hour window most days. The need to choose "clean , green" food 50 to 75% of the time is discussed, as well as to make water the primary drink and set a goal of 64 ounces water daily.    Weight /BMI 05/10/2020 03/27/2020 07/09/2019  WEIGHT 188 lb 0.6 oz 184 lb 1.9 oz 179 lb  HEIGHT 5\' 10"  5\' 10"  5\' 6"   BMI 26.98 kg/m2 26.42 kg/m2 28.89 kg/m2

## 2020-05-10 NOTE — Patient Instructions (Addendum)
  F/U in office with MD re evaluate  Blood pressure, in 6 to 8 weeks, call if you need me sooner  New higher dose of amlodipine is 10 mg one daily, stop amlodipine 5 mg tabet once you start the 10 mg tablet Continue cardura as before  Lab today lipid, CBC, lipid, cmp and EGFr, tSH   Diet rich in fruit and vegetable, beans , white meat, fresh or frozen , low salt,and cut back on deli meats and all canned foods  It is important that you exercise regularly at least 30 minutes 5 times a week. If you develop chest pain, have severe difficulty breathing, or feel very tired, stop exercising immediately and seek medical attention   Thanks for choosing [facility], we consider it a privelige to serve you.

## 2020-05-11 LAB — COMPLETE METABOLIC PANEL WITH GFR
AG Ratio: 1.5 (calc) (ref 1.0–2.5)
ALT: 17 U/L (ref 9–46)
AST: 23 U/L (ref 10–35)
Albumin: 4.6 g/dL (ref 3.6–5.1)
Alkaline phosphatase (APISO): 43 U/L (ref 35–144)
BUN/Creatinine Ratio: 18 (calc) (ref 6–22)
BUN: 26 mg/dL — ABNORMAL HIGH (ref 7–25)
CO2: 29 mmol/L (ref 20–32)
Calcium: 10 mg/dL (ref 8.6–10.3)
Chloride: 102 mmol/L (ref 98–110)
Creat: 1.41 mg/dL — ABNORMAL HIGH (ref 0.70–1.18)
GFR, Est African American: 56 mL/min/{1.73_m2} — ABNORMAL LOW (ref >=60)
GFR, Est Non African American: 48 mL/min/{1.73_m2} — ABNORMAL LOW (ref >=60)
Globulin: 3 g/dL (calc) (ref 1.9–3.7)
Glucose, Bld: 105 mg/dL — ABNORMAL HIGH (ref 65–99)
Potassium: 4.8 mmol/L (ref 3.5–5.3)
Sodium: 137 mmol/L (ref 135–146)
Total Bilirubin: 0.6 mg/dL (ref 0.2–1.2)
Total Protein: 7.6 g/dL (ref 6.1–8.1)

## 2020-05-11 LAB — CBC
HCT: 38.2 % — ABNORMAL LOW (ref 38.5–50.0)
Hemoglobin: 12.4 g/dL — ABNORMAL LOW (ref 13.2–17.1)
MCH: 30.3 pg (ref 27.0–33.0)
MCHC: 32.5 g/dL (ref 32.0–36.0)
MCV: 93.4 fL (ref 80.0–100.0)
MPV: 12.7 fL — ABNORMAL HIGH (ref 7.5–12.5)
Platelets: 167 10*3/uL (ref 140–400)
RBC: 4.09 10*6/uL — ABNORMAL LOW (ref 4.20–5.80)
RDW: 11 % (ref 11.0–15.0)
WBC: 5.6 10*3/uL (ref 3.8–10.8)

## 2020-05-11 LAB — TSH: TSH: 0.89 mIU/L (ref 0.40–4.50)

## 2020-05-11 LAB — LIPID PANEL
Cholesterol: 187 mg/dL (ref <200)
HDL: 64 mg/dL (ref >=40)
LDL Cholesterol (Calc): 106 mg/dL (calc) — ABNORMAL HIGH
Non-HDL Cholesterol (Calc): 123 mg/dL (calc) (ref <130)
Total CHOL/HDL Ratio: 2.9 (calc) (ref <5.0)
Triglycerides: 82 mg/dL (ref <150)

## 2020-05-11 NOTE — Assessment & Plan Note (Signed)
  Patient re-educated about  the importance of commitment to a  minimum of 150 minutes of exercise per week as able.  The importance of healthy food choices with portion control discussed, as well as eating regularly and within a 12 hour window most days. The need to choose "clean , green" food 50 to 75% of the time is discussed, as well as to make water the primary drink and set a goal of 64 ounces water daily.    Weight /BMI 05/10/2020 03/27/2020 07/09/2019  WEIGHT 188 lb 0.6 oz 184 lb 1.9 oz 179 lb  HEIGHT 5\' 10"  5\' 10"  5\' 6"   BMI 26.98 kg/m2 26.42 kg/m2 28.89 kg/m2

## 2020-05-11 NOTE — Assessment & Plan Note (Signed)
Uncontrolled, increase amlodipine dose to 10 mg daily DASH diet and commitment to daily physical activity for a minimum of 30 minutes discussed and encouraged, as a part of hypertension management. The importance of attaining a healthy weight is also discussed.  BP/Weight 05/10/2020 03/27/2020 07/09/2019 05/25/2019 03/24/2019 07/07/2018 03/03/2018  Systolic BP 172 132 120 120 160 144 130  Diastolic BP 90 74 70 70 58 69 56  Wt. (Lbs) 188.04 184.12 179 179 183.04 188 185.12  BMI 26.98 26.42 28.89 28.89 29.54 30.34 26.56

## 2020-07-10 ENCOUNTER — Ambulatory Visit: Payer: Medicare Other | Admitting: Family Medicine

## 2020-07-11 ENCOUNTER — Other Ambulatory Visit: Payer: Self-pay

## 2020-07-11 ENCOUNTER — Encounter: Payer: Medicare Other | Admitting: Family Medicine

## 2020-07-11 NOTE — Progress Notes (Signed)
This encounter was created in error - please disregard.

## 2020-07-20 ENCOUNTER — Other Ambulatory Visit: Payer: Self-pay

## 2020-07-20 ENCOUNTER — Encounter: Payer: Self-pay | Admitting: Family Medicine

## 2020-07-20 ENCOUNTER — Ambulatory Visit (INDEPENDENT_AMBULATORY_CARE_PROVIDER_SITE_OTHER): Payer: Medicare Other | Admitting: Family Medicine

## 2020-07-20 VITALS — BP 172/90 | Ht 70.0 in | Wt 188.0 lb

## 2020-07-20 DIAGNOSIS — Z0000 Encounter for general adult medical examination without abnormal findings: Secondary | ICD-10-CM

## 2020-07-20 NOTE — Progress Notes (Addendum)
Subjective:   Bill Ward is a 74 y.o. male who presents for Medicare Annual/Subsequent preventive examination.  Participants: Nurse for intake and work up; Patient and Provider for Visit and Wrap up  Method of visit: Telephone Location of Patient: Home Location of Provider: Office Consent was obtain for visit over the telephone. Services rendered by provider: Visit was performed via telephone   I verified that I am speaking with the correct person using two identifiers.  Review of Systems Cardiac Risk Factors include: advanced age (>73men, >76 women)     Objective:    Today's Vitals   07/20/20 1059 07/20/20 1101  BP: (!) 172/90   Weight: 188 lb (85.3 kg)   Height: 5\' 10"  (1.778 m)   PainSc: 0-No pain 0-No pain   Body mass index is 26.98 kg/m.  Advanced Directives 07/20/2020 07/07/2018 06/30/2017 01/02/2017  Does Patient Have a Medical Advance Directive? No No No No  Would patient like information on creating a medical advance directive? No - Patient declined No - Patient declined No - Patient declined No - Patient declined    Current Medications (verified) Outpatient Encounter Medications as of 07/20/2020  Medication Sig  . amLODipine (NORVASC) 10 MG tablet Take 1 tablet (10 mg total) by mouth daily.  07/22/2020 doxazosin (CARDURA) 4 MG tablet Take 1 tablet (4 mg total) by mouth daily.   No facility-administered encounter medications on file as of 07/20/2020.    Allergies (verified) Patient has no known allergies.   History: Past Medical History:  Diagnosis Date  . Allergy   . Erectile dysfunction   . Essential hypertension, benign    Past Surgical History:  Procedure Laterality Date  . COLONOSCOPY    . COLONOSCOPY N/A 01/02/2017   Procedure: COLONOSCOPY;  Surgeon: 01/04/2017, MD;  Location: [facility];  Service: Endoscopy;  Laterality: N/A;  930   Family History  Problem Relation Age of Onset  . Hypertension Mother   . Cancer Father        bone   . Diabetes Sister   . Hypertension Sister   . Hypertension Brother   . Colon cancer Neg Hx    Social History   Socioeconomic History  . Marital status: Single    Spouse name: Not on file  . Number of children: 0  . Years of education: 39  . Highest education level: 11th grade  Occupational History  . Occupation: retired   Tobacco Use  . Smoking status: Former Smoker    Packs/day: 0.50    Years: 35.00    Pack years: 17.50    Types: Cigarettes  . Smokeless tobacco: Never Used  Vaping Use  . Vaping Use: Never used  Substance and Sexual Activity  . Alcohol use: No  . Drug use: No  . Sexual activity: Yes  Other Topics Concern  . Not on file  Social History Narrative   Lives with sister and a brother    Social Determinants of Health   Financial Resource Strain: Low Risk   . Difficulty of Paying Living Expenses: Not hard at all  Food Insecurity: No Food Insecurity  . Worried About 4 in the Last Year: Never true  . Ran Out of Food in the Last Year: Never true  Transportation Needs: No Transportation Needs  . Lack of Transportation (Medical): No  . Lack of Transportation (Non-Medical): No  Physical Activity: Sufficiently Active  . Days of Exercise per Week: 5 days  .  Minutes of Exercise per Session: 60 min  Stress: No Stress Concern Present  . Feeling of Stress : Not at all  Social Connections: Socially Isolated  . Frequency of Communication with Friends and Family: Three times a week  . Frequency of Social Gatherings with Friends and Family: More than three times a week  . Attends Religious Services: Never  . Active Member of Clubs or Organizations: No  . Attends Banker Meetings: Never  . Marital Status: Never married    Tobacco Counseling Counseling given: Yes   Clinical Intake:  Pre-visit preparation completed: Yes  Pain : No/denies pain Pain Score: 0-No pain     BMI - recorded: 26.98 Nutritional Status: BMI 25 -29  Overweight Nutritional Risks: None Diabetes: No  How often do you need to have someone help you when you read instructions, pamphlets, or other written materials from your doctor or pharmacy?: 1 - Never What is the last grade level you completed in school?: 11  Diabetic? no  Interpreter Needed?: No  Information entered by :: Jerilynn Mages, LPN   Activities of Daily Living In your present state of health, do you have any difficulty performing the following activities: 07/20/2020  Hearing? N  Vision? N  Difficulty concentrating or making decisions? N  Walking or climbing stairs? N  Dressing or bathing? N  Doing errands, shopping? N  Preparing Food and eating ? N  Using the Toilet? N  In the past six months, have you accidently leaked urine? N  Do you have problems with loss of bowel control? N  Managing your Medications? N  Managing your Finances? N  Housekeeping or managing your Housekeeping? N  Some recent data might be hidden    Patient Care Team: Kerri Perches, MD as PCP - General  Indicate any recent Medical Services you may have received from other than [facility] providers in the past year (date may be approximate).     Assessment:   This is a routine wellness examination for Bill Ward.  Hearing/Vision screen No exam data present  Dietary issues and exercise activities discussed: Current Exercise Habits: Home exercise routine, Type of exercise: walking, Time (Minutes): 60, Frequency (Times/Week): 5, Weekly Exercise (Minutes/Week): 300, Intensity: Mild  Goals    . DIET - INCREASE WATER INTAKE      Depression Screen PHQ 2/9 Scores 07/20/2020 05/10/2020 03/27/2020 07/09/2019 03/24/2019 07/07/2018 03/03/2018  PHQ - 2 Score 0 0 0 0 0 0 0  Exception Documentation - - Medical reason - - - -    Fall Risk Fall Risk  07/20/2020 05/10/2020 03/27/2020 07/09/2019 05/25/2019  Falls in the past year? 0 0 0 0 0  Number falls in past yr: 0 0 0 0 0  Injury with Fall? 0 0 0 0 0  Risk  for fall due to : No Fall Risks - No Fall Risks - -  Follow up Falls evaluation completed Falls evaluation completed Falls evaluation completed - -    FALL RISK PREVENTION PERTAINING TO THE HOME:  Any stairs in or around the home? No  If so, are there any without handrails? No  Home free of loose throw rugs in walkways, pet beds, electrical cords, etc? Yes  Adequate lighting in your home to reduce risk of falls? Yes   ASSISTIVE DEVICES UTILIZED TO PREVENT FALLS:  Life alert? No  Use of a cane, walker or w/c? No  Grab bars in the bathroom? No  Shower chair or bench in shower? No  Elevated toilet seat or a handicapped toilet? No   TIMED UP AND GO:  Was the test performed? No .  Length of time to ambulate n/a    Cognitive Function:     6CIT Screen 07/20/2020 07/09/2019 07/07/2018 06/30/2017  What Year? 0 points 0 points 0 points 4 points  What month? 0 points 0 points 0 points 0 points  What time? 0 points 0 points 0 points 3 points  Count back from 20 4 points 0 points 4 points 4 points  Months in reverse 4 points 4 points 0 points 4 points  Repeat phrase 0 points 10 points 0 points 4 points  Total Score 8 14 4 19     Immunizations Immunization History  Administered Date(s) Administered  . Fluad Quad(high Dose 65+) 03/24/2019  . Influenza Split 07/09/2011, 05/07/2012  . Influenza Whole 05/14/2005, 06/19/2009, 06/26/2010  . Influenza,inj,Quad PF,6+ Mos 05/12/2013, 05/23/2014, 05/01/2015, 05/13/2016, 04/16/2017, 05/27/2018, 03/27/2020  . Moderna Sars-Covid-2 Vaccination 10/09/2019, 11/06/2019  . Pneumococcal Conjugate-13 03/22/2014  . Pneumococcal Polysaccharide-23 02/07/2009  . Td 11/08/2004    TDAP status: Due, Education has been provided regarding the importance of this vaccine. Advised may receive this vaccine at local pharmacy or Health Dept. Aware to provide a copy of the vaccination record if obtained from local pharmacy or Health Dept. Verbalized acceptance and  understanding.  Flu Vaccine status: Up to date  Pneumococcal vaccine status: Up to date  Covid-19 vaccine status: Completed vaccines  Qualifies for Shingles Vaccine? Yes   Zostavax completed No   Shingrix Completed?: No.    Education has been provided regarding the importance of this vaccine. Patient has been advised to call insurance company to determine out of pocket expense if they have not yet received this vaccine. Advised may also receive vaccine at local pharmacy or Health Dept. Verbalized acceptance and understanding.  Screening Tests Health Maintenance  Topic Date Due  . COVID-19 Vaccine (3 - Booster for Moderna series) 05/07/2020  . TETANUS/TDAP  03/27/2024 (Originally 11/09/2014)  . INFLUENZA VACCINE  Completed  . Hepatitis C Screening  Completed  . PNA vac Low Risk Adult  Completed    Health Maintenance  Health Maintenance Due  Topic Date Due  . COVID-19 Vaccine (3 - Booster for Moderna series) 05/07/2020    Colorectal cancer screening: Type of screening: Colonoscopy. Completed 01/02/17. Repeat every 10 years  Lung Cancer Screening: (Low Dose CT Chest recommended if Age 29-80 years, 30 pack-year currently smoking OR have quit w/in 15years.) does not qualify.   Lung Cancer Screening Referral: n/a  Additional Screening:  Hepatitis C Screening: does not qualify  Vision Screening: Recommended annual ophthalmology exams for early detection of glaucoma and other disorders of the eye. Is the patient up to date with their annual eye exam?  Yes  Who is the provider or what is the name of the office in which the patient attends annual eye exams? [facility] in [place] If pt is not established with a provider, would they like to be referred to a provider to establish care? n/a.   Dental Screening: Recommended annual dental exams for proper oral hygiene  Community Resource Referral / Chronic Care Management: CRR required this visit?  No   CCM required this visit?  No       Plan:     1. Encounter for Medicare annual wellness exam   I have personally reviewed and noted the following in the patient's chart:   . Medical and social history .  Use of alcohol, tobacco or illicit drugs  . Current medications and supplements . Functional ability and status . Nutritional status . Physical activity . Advanced directives . List of other physicians . Hospitalizations, surgeries, and ER visits in previous 12 months . Vitals . Screenings to include cognitive, depression, and falls . Referrals and appointments  In addition, I have reviewed and discussed with patient certain preventive protocols, quality metrics, and best practice recommendations. A written personalized care plan for preventive services as well as general preventive health recommendations were provided to patient.    Agreed to the above documentation   Freddy FinnerHannah M Mills, NP   07/20/2020   Nurse Notes: AWV conducted by nurse in office by phone. Patient gave consent to telehealth visit via audio. Patient at home at time of visit. Provider here in the office. Visit took 30 minutes to complete.

## 2020-07-20 NOTE — Patient Instructions (Addendum)
Bill Ward , Thank you for taking time to come for your Medicare Wellness Visit. I appreciate your ongoing commitment to your health goals. Please review the following plan we discussed and let me know if I can assist you in the future.   Merry Christmas and Happy New Year!  Please continue to practice social distancing to keep you, your family, and our community safe.  If you must go out, please wear a Mask and practice good handwashing.  Screening recommendations/referrals: Colonoscopy: Complete Recommended yearly ophthalmology/optometry visit for glaucoma screening and checkup Recommended yearly dental visit for hygiene and checkup  Vaccinations: Influenza vaccine: Fall 2021 Pneumococcal vaccine: Complete Tdap vaccine: Due Shingles vaccine: Declined  Advanced directives: No  Conditions/risks identified: None  Next appointment: 10/11/20 @ 10 am  Preventive Care 76 Years and Older, Male Preventive care refers to lifestyle choices and visits with your health care provider that can promote health and wellness. What does preventive care include?  A yearly physical exam. This is also called an annual well check.  Dental exams once or twice a year.  Routine eye exams. Ask your health care provider how often you should have your eyes checked.  Personal lifestyle choices, including:  Daily care of your teeth and gums.  Regular physical activity.  Eating a healthy diet.  Avoiding tobacco and drug use.  Limiting alcohol use.  Practicing safe sex.  Taking low doses of aspirin every day.  Taking vitamin and mineral supplements as recommended by your health care provider. What happens during an annual well check? The services and screenings done by your health care provider during your annual well check will depend on your age, overall health, lifestyle risk factors, and family history of disease. Counseling  Your health care provider may ask you questions about  your:  Alcohol use.  Tobacco use.  Drug use.  Emotional well-being.  Home and relationship well-being.  Sexual activity.  Eating habits.  History of falls.  Memory and ability to understand (cognition).  Work and work Astronomer. Screening  You may have the following tests or measurements:  Height, weight, and BMI.  Blood pressure.  Lipid and cholesterol levels. These may be checked every 5 years, or more frequently if you are over 55 years old.  Skin check.  Lung cancer screening. You may have this screening every year starting at age 66 if you have a 30-pack-year history of smoking and currently smoke or have quit within the past 15 years.  Fecal occult blood test (FOBT) of the stool. You may have this test every year starting at age 51.  Flexible sigmoidoscopy or colonoscopy. You may have a sigmoidoscopy every 5 years or a colonoscopy every 10 years starting at age 38.  Prostate cancer screening. Recommendations will vary depending on your family history and other risks.  Hepatitis C blood test.  Hepatitis B blood test.  Sexually transmitted disease (STD) testing.  Diabetes screening. This is done by checking your blood sugar (glucose) after you have not eaten for a while (fasting). You may have this done every 1-3 years.  Abdominal aortic aneurysm (AAA) screening. You may need this if you are a current or former smoker.  Osteoporosis. You may be screened starting at age 58 if you are at high risk. Talk with your health care provider about your test results, treatment options, and if necessary, the need for more tests. Vaccines  Your health care provider may recommend certain vaccines, such as:  Influenza vaccine. This is  recommended every year.  Tetanus, diphtheria, and acellular pertussis (Tdap, Td) vaccine. You may need a Td booster every 10 years.  Zoster vaccine. You may need this after age 25.  Pneumococcal 13-valent conjugate (PCV13) vaccine.  One dose is recommended after age 12.  Pneumococcal polysaccharide (PPSV23) vaccine. One dose is recommended after age 29. Talk to your health care provider about which screenings and vaccines you need and how often you need them. This information is not intended to replace advice given to you by your health care provider. Make sure you discuss any questions you have with your health care provider. Document Released: 08/18/2015 Document Revised: 04/10/2016 Document Reviewed: 05/23/2015 Elsevier Interactive Patient Education  2017 [employer].  Fall Prevention in the Home Falls can cause injuries. They can happen to people of all ages. There are many things you can do to make your home safe and to help prevent falls. What can I do on the outside of my home?  Regularly fix the edges of walkways and driveways and fix any cracks.  Remove anything that might make you trip as you walk through a door, such as a raised step or threshold.  Trim any bushes or trees on the path to your home.  Use bright outdoor lighting.  Clear any walking paths of anything that might make someone trip, such as rocks or tools.  Regularly check to see if handrails are loose or broken. Make sure that both sides of any steps have handrails.  Any raised decks and porches should have guardrails on the edges.  Have any leaves, snow, or ice cleared regularly.  Use sand or salt on walking paths during winter.  Clean up any spills in your garage right away. This includes oil or grease spills. What can I do in the bathroom?  Use night lights.  Install grab bars by the toilet and in the tub and shower. Do not use towel bars as grab bars.  Use non-skid mats or decals in the tub or shower.  If you need to sit down in the shower, use a plastic, non-slip stool.  Keep the floor dry. Clean up any water that spills on the floor as soon as it happens.  Remove soap buildup in the tub or shower regularly.  Attach bath  mats securely with double-sided non-slip rug tape.  Do not have throw rugs and other things on the floor that can make you trip. What can I do in the bedroom?  Use night lights.  Make sure that you have a light by your bed that is easy to reach.  Do not use any sheets or blankets that are too big for your bed. They should not hang down onto the floor.  Have a firm chair that has side arms. You can use this for support while you get dressed.  Do not have throw rugs and other things on the floor that can make you trip. What can I do in the kitchen?  Clean up any spills right away.  Avoid walking on wet floors.  Keep items that you use a lot in easy-to-reach places.  If you need to reach something above you, use a strong step stool that has a grab bar.  Keep electrical cords out of the way.  Do not use floor polish or wax that makes floors slippery. If you must use wax, use non-skid floor wax.  Do not have throw rugs and other things on the floor that can make you trip.  What can I do with my stairs?  Do not leave any items on the stairs.  Make sure that there are handrails on both sides of the stairs and use them. Fix handrails that are broken or loose. Make sure that handrails are as long as the stairways.  Check any carpeting to make sure that it is firmly attached to the stairs. Fix any carpet that is loose or worn.  Avoid having throw rugs at the top or bottom of the stairs. If you do have throw rugs, attach them to the floor with carpet tape.  Make sure that you have a light switch at the top of the stairs and the bottom of the stairs. If you do not have them, ask someone to add them for you. What else can I do to help prevent falls?  Wear shoes that:  Do not have high heels.  Have rubber bottoms.  Are comfortable and fit you well.  Are closed at the toe. Do not wear sandals.  If you use a stepladder:  Make sure that it is fully opened. Do not climb a closed  stepladder.  Make sure that both sides of the stepladder are locked into place.  Ask someone to hold it for you, if possible.  Clearly mark and make sure that you can see:  Any grab bars or handrails.  First and last steps.  Where the edge of each step is.  Use tools that help you move around (mobility aids) if they are needed. These include:  Canes.  Walkers.  Scooters.  Crutches.  Turn on the lights when you go into a dark area. Replace any light bulbs as soon as they burn out.  Set up your furniture so you have a clear path. Avoid moving your furniture around.  If any of your floors are uneven, fix them.  If there are any pets around you, be aware of where they are.  Review your medicines with your doctor. Some medicines can make you feel dizzy. This can increase your chance of falling. Ask your doctor what other things that you can do to help prevent falls. This information is not intended to replace advice given to you by your health care provider. Make sure you discuss any questions you have with your health care provider. Document Released: 05/18/2009 Document Revised: 12/28/2015 Document Reviewed: 08/26/2014 Elsevier Interactive Patient Education  2017 [employer].

## 2020-10-11 ENCOUNTER — Ambulatory Visit: Payer: Medicare Other | Admitting: Family Medicine

## 2020-10-12 ENCOUNTER — Encounter: Payer: Self-pay | Admitting: Internal Medicine

## 2020-10-12 ENCOUNTER — Ambulatory Visit (INDEPENDENT_AMBULATORY_CARE_PROVIDER_SITE_OTHER): Payer: Medicare Other | Admitting: Internal Medicine

## 2020-10-12 ENCOUNTER — Other Ambulatory Visit: Payer: Self-pay

## 2020-10-12 VITALS — BP 140/75 | HR 94 | Resp 18 | Ht 70.0 in | Wt 185.8 lb

## 2020-10-12 DIAGNOSIS — N1831 Chronic kidney disease, stage 3a: Secondary | ICD-10-CM

## 2020-10-12 DIAGNOSIS — I10 Essential (primary) hypertension: Secondary | ICD-10-CM

## 2020-10-12 MED ORDER — AMLODIPINE BESYLATE 10 MG PO TABS: 10.0000 mg | ORAL_TABLET | Freq: Every day | ORAL | 1 refills | Status: DC

## 2020-10-12 NOTE — Progress Notes (Signed)
Established Patient Office Visit  Subjective:  Patient ID: Bill Ward, male    DOB: Aug 21, 1945  Age: 75 y.o. MRN: [id number]  CC:  Chief Complaint  Patient presents with  . Follow-up    6.5 month follow up     HPI Bill Ward presents for follow up of his HTN. He has been doing well overall.  BP is stable overall. BP was elevated initially, but was better later on. Takes medications regularly. Patient denies headache, dizziness, chest pain, dyspnea or palpitations. He was on ACEi in the past, but had to be discontinued due to hyperkalemia.   Past Medical History:  Diagnosis Date  . Allergy   . Erectile dysfunction   . Essential hypertension, benign     Past Surgical History:  Procedure Laterality Date  . COLONOSCOPY    . COLONOSCOPY N/A 01/02/2017   Procedure: COLONOSCOPY;  Surgeon: Malissa Hippo, MD;  Location: [facility];  Service: Endoscopy;  Laterality: N/A;  930    Family History  Problem Relation Age of Onset  . Hypertension Mother   . Cancer Father        bone  . Diabetes Sister   . Hypertension Sister   . Hypertension Brother   . Colon cancer Neg Hx     Social History   Socioeconomic History  . Marital status: Single    Spouse name: Not on file  . Number of children: 0  . Years of education: 69  . Highest education level: 11th grade  Occupational History  . Occupation: retired   Tobacco Use  . Smoking status: Former Smoker    Packs/day: 0.50    Years: 35.00    Pack years: 17.50    Types: Cigarettes  . Smokeless tobacco: Never Used  Vaping Use  . Vaping Use: Never used  Substance and Sexual Activity  . Alcohol use: No  . Drug use: No  . Sexual activity: Yes  Other Topics Concern  . Not on file  Social History Narrative   Lives with sister and a brother    Social Determinants of Health   Financial Resource Strain: Low Risk   . Difficulty of Paying Living Expenses: Not hard at all  Food Insecurity: No Food Insecurity  .  Worried About Programme researcher, broadcasting/film/video in the Last Year: Never true  . Ran Out of Food in the Last Year: Never true  Transportation Needs: No Transportation Needs  . Lack of Transportation (Medical): No  . Lack of Transportation (Non-Medical): No  Physical Activity: Sufficiently Active  . Days of Exercise per Week: 5 days  . Minutes of Exercise per Session: 60 min  Stress: No Stress Concern Present  . Feeling of Stress : Not at all  Social Connections: Socially Isolated  . Frequency of Communication with Friends and Family: Three times a week  . Frequency of Social Gatherings with Friends and Family: More than three times a week  . Attends Religious Services: Never  . Active Member of Clubs or Organizations: No  . Attends Banker Meetings: Never  . Marital Status: Never married  Intimate Partner Violence: Not At Risk  . Fear of Current or Ex-Partner: No  . Emotionally Abused: No  . Physically Abused: No  . Sexually Abused: No    Outpatient Medications Prior to Visit  Medication Sig Dispense Refill  . doxazosin (CARDURA) 4 MG tablet Take 1 tablet (4 mg total) by mouth daily. 90 tablet 1  .  amLODipine (NORVASC) 10 MG tablet Take 1 tablet (10 mg total) by mouth daily. 30 tablet 3   No facility-administered medications prior to visit.    No Known Allergies  ROS Review of Systems  Constitutional: Negative for chills and fever.  HENT: Negative for congestion and sore throat.   Eyes: Negative for pain and discharge.  Respiratory: Negative for cough and shortness of breath.   Cardiovascular: Negative for chest pain and palpitations.  Gastrointestinal: Negative for constipation, diarrhea, nausea and vomiting.  Endocrine: Negative for polydipsia and polyuria.  Genitourinary: Negative for dysuria and hematuria.  Musculoskeletal: Negative for neck pain and neck stiffness.  Skin: Negative for rash.  Neurological: Negative for dizziness, weakness, numbness and headaches.   Psychiatric/Behavioral: Negative for agitation and behavioral problems.      Objective:    Physical Exam Vitals reviewed.  Constitutional:      General: He is not in acute distress.    Appearance: He is not diaphoretic.  HENT:     Head: Normocephalic and atraumatic.     Nose: Nose normal.     Mouth/Throat:     Mouth: Mucous membranes are moist.  Eyes:     General: No scleral icterus.    Extraocular Movements: Extraocular movements intact.     Pupils: Pupils are equal, round, and reactive to light.  Cardiovascular:     Rate and Rhythm: Normal rate and regular rhythm.     Pulses: Normal pulses.     Heart sounds: Normal heart sounds. No murmur heard.   Pulmonary:     Breath sounds: Normal breath sounds. No wheezing or rales.  Abdominal:     Palpations: Abdomen is soft.     Tenderness: There is no abdominal tenderness.  Musculoskeletal:     Cervical back: Neck supple. No tenderness.     Right lower leg: No edema.     Left lower leg: No edema.  Skin:    General: Skin is warm.     Findings: No rash.  Neurological:     General: No focal deficit present.     Mental Status: He is alert and oriented to person, place, and time.     Sensory: No sensory deficit.     Motor: No weakness.  Psychiatric:        Mood and Affect: Mood normal.        Behavior: Behavior normal.     BP 140/75 (BP Location: Right Arm, Patient Position: Sitting, Cuff Size: Normal)   Pulse 94   Resp 18   Ht 5\' 10"  (1.778 m)   Wt 185 lb 12.8 oz (84.3 kg)   SpO2 97%   BMI 26.66 kg/m  Wt Readings from Last 3 Encounters:  10/12/20 185 lb 12.8 oz (84.3 kg)  07/20/20 188 lb (85.3 kg)  05/10/20 188 lb 0.6 oz (85.3 kg)     Health Maintenance Due  Topic Date Due  . COVID-19 Vaccine (3 - Booster for Moderna series) 05/07/2020    There are no preventive care reminders to display for this patient.  Lab Results  Component Value Date   TSH 0.89 05/10/2020   Lab Results  Component Value Date    WBC 5.6 05/10/2020   HGB 12.4 (L) 05/10/2020   HCT 38.2 (L) 05/10/2020   MCV 93.4 05/10/2020   PLT 167 05/10/2020   Lab Results  Component Value Date   NA 137 05/10/2020   K 4.8 05/10/2020   CO2 29 05/10/2020   GLUCOSE 105 (H)  05/10/2020   BUN 26 (H) 05/10/2020   CREATININE 1.41 (H) 05/10/2020   BILITOT 0.6 05/10/2020   AST 23 05/10/2020   ALT 17 05/10/2020   PROT 7.6 05/10/2020   CALCIUM 10.0 05/10/2020   Lab Results  Component Value Date   CHOL 187 05/10/2020   Lab Results  Component Value Date   HDL 64 05/10/2020   Lab Results  Component Value Date   LDLCALC 106 (H) 05/10/2020   Lab Results  Component Value Date   TRIG 82 05/10/2020   Lab Results  Component Value Date   CHOLHDL 2.9 05/10/2020   Lab Results  Component Value Date   HGBA1C 5.3 08/08/2015      Assessment & Plan:   Problem List Items Addressed This Visit      Cardiovascular and Mediastinum   Essential hypertension - Primary    BP Readings from Last 1 Encounters:  10/12/20 140/75   Stable with Amlodipine and Doxazosin Would avoid changing medication for now, considering his age, his BP is around target level Counseled for compliance with the medications Low salt diet and activity as tolerated      Relevant Medications   amLODipine (NORVASC) 10 MG tablet   Other Relevant Orders   CBC   Hemoglobin A1c    Other Visit Diagnoses    Stage 3a chronic kidney disease (HCC)   Check BMP Plan to add Thiazide diuretic if stable CKD     Relevant Orders   Basic Metabolic Panel (BMET)      Meds ordered this encounter  Medications  . amLODipine (NORVASC) 10 MG tablet    Sig: Take 1 tablet (10 mg total) by mouth daily.    Dispense:  90 tablet    Refill:  1    Follow-up: Return in about 6 months (around 04/14/2021) for Annual physical.    Anabel Halon, MD

## 2020-10-12 NOTE — Assessment & Plan Note (Signed)
BP Readings from Last 1 Encounters:  10/12/20 140/75   Stable with Amlodipine and Doxazosin Would avoid changing medication for now, considering his age, his BP is around target level Counseled for compliance with the medications Low salt diet and activity as tolerated

## 2020-10-12 NOTE — Patient Instructions (Signed)
Please continue taking medications as prescribed. ° °Please get fasting blood tests done within a week. °

## 2020-10-16 DIAGNOSIS — Z131 Encounter for screening for diabetes mellitus: Secondary | ICD-10-CM | POA: Diagnosis not present

## 2020-10-16 DIAGNOSIS — N1831 Chronic kidney disease, stage 3a: Secondary | ICD-10-CM | POA: Diagnosis not present

## 2020-10-16 DIAGNOSIS — I10 Essential (primary) hypertension: Secondary | ICD-10-CM | POA: Diagnosis not present

## 2020-10-17 LAB — BASIC METABOLIC PANEL
BUN/Creatinine Ratio: 16 (ref 10–24)
BUN: 21 mg/dL (ref 8–27)
CO2: 21 mmol/L (ref 20–29)
Calcium: 9.5 mg/dL (ref 8.6–10.2)
Chloride: 101 mmol/L (ref 96–106)
Creatinine, Ser: 1.31 mg/dL — ABNORMAL HIGH (ref 0.76–1.27)
Glucose: 98 mg/dL (ref 65–99)
Potassium: 4.9 mmol/L (ref 3.5–5.2)
Sodium: 138 mmol/L (ref 134–144)
eGFR: 56 mL/min/{1.73_m2} — ABNORMAL LOW (ref >59)

## 2020-10-17 LAB — HEMOGLOBIN A1C
Est. average glucose Bld gHb Est-mCnc: 97 mg/dL
Hgb A1c MFr Bld: 5 % (ref 4.8–5.6)

## 2020-10-17 LAB — CBC
Hematocrit: 38.4 % (ref 37.5–51.0)
Hemoglobin: 12.5 g/dL — ABNORMAL LOW (ref 13.0–17.7)
MCH: 30 pg (ref 26.6–33.0)
MCHC: 32.6 g/dL (ref 31.5–35.7)
MCV: 92 fL (ref 79–97)
Platelets: 165 10*3/uL (ref 150–450)
RBC: 4.17 x10E6/uL (ref 4.14–5.80)
RDW: 11.8 % (ref 11.6–15.4)
WBC: 4.5 10*3/uL (ref 3.4–10.8)

## 2020-12-12 ENCOUNTER — Other Ambulatory Visit: Payer: Self-pay | Admitting: *Deleted

## 2020-12-12 NOTE — Progress Notes (Unsigned)
a 

## 2020-12-19 ENCOUNTER — Other Ambulatory Visit: Payer: Self-pay | Admitting: Family Medicine

## 2020-12-19 ENCOUNTER — Telehealth: Payer: Self-pay

## 2020-12-19 NOTE — Telephone Encounter (Signed)
Patient called need med refill  amLODipine (NORVASC) 10 MG tablet Pharmacy  [facility] [place], [place] - [address] [place], [address]  [address], [address], [place] [address]  Phone:  [phone] Fax:  [phone]

## 2020-12-20 ENCOUNTER — Other Ambulatory Visit: Payer: Self-pay

## 2020-12-20 DIAGNOSIS — I10 Essential (primary) hypertension: Secondary | ICD-10-CM

## 2020-12-20 MED ORDER — AMLODIPINE BESYLATE 10 MG PO TABS: 10.0000 mg | ORAL_TABLET | Freq: Every day | ORAL | 1 refills | Status: DC

## 2020-12-20 NOTE — Telephone Encounter (Signed)
Refill sent.

## 2021-02-21 ENCOUNTER — Other Ambulatory Visit: Payer: Self-pay | Admitting: Family Medicine

## 2021-03-30 ENCOUNTER — Telehealth: Payer: Self-pay | Admitting: *Deleted

## 2021-03-30 NOTE — Chronic Care Management (AMB) (Signed)
  Chronic Care Management   Outreach Note  03/30/2021 Name: TAVON MAGNUSSEN MRN: [id number] DOB: 10-20-1945  Annett Fabian is a 75 y.o. year old male who is a primary care patient of Kerri Perches, MD. I reached out to Annett Fabian by phone today in response to a referral sent by Mr. Ledger Heindl Smelcer's PCP, Dr. Lodema Hong.      An unsuccessful telephone outreach was attempted today. The patient was referred to the case management team for assistance with care management and care coordination.   Follow Up Plan: The care management team will reach out to the patient again over the next 7 days.  If patient returns call to provider office, please advise to call Embedded Care Management Care Guide Misty Stanley at [phone].  Gwenevere Ghazi  Care Guide, Embedded Care Coordination [facility] Management  Direct Dial: [phone]

## 2021-04-05 NOTE — Chronic Care Management (AMB) (Signed)
  Chronic Care Management   Outreach Note  04/05/2021 Name: KONGMENG SANTORO MRN: [id number] DOB: March 01, 1946  Annett Fabian is a 75 y.o. year old male who is a primary care patient of Kerri Perches, MD. I reached out to Annett Fabian by phone today in response to a referral sent by Mr. Brasen Bundren Safi's PCP, Dr. Lodema Hong.       A second unsuccessful telephone outreach was attempted today. The patient was referred to the case management team for assistance with care management and care coordination.   Follow Up Plan: The care management team will reach out to the patient again over the next 7 days. If patient returns call to provider office, please advise to call Embedded Care Management Care Guide Misty Stanley at [phone].  Gwenevere Ghazi  Care Guide, Embedded Care Coordination [facility] Management  Direct Dial: [phone]

## 2021-04-12 NOTE — Chronic Care Management (AMB) (Signed)
  Chronic Care Management   Outreach Note  04/12/2021 Name: Bill Ward MRN: [id number] DOB: Mar 08, 1946  Bill Ward is a 75 y.o. year old male who is a primary care patient of Kerri Perches, MD. I reached out to Bill Ward by phone today in response to a referral sent by [name]'s PCP, Dr. Lodema Hong      Third unsuccessful telephone outreach was attempted today. The patient was referred to the case management team for assistance with care management and care coordination. The patient's primary care provider has been notified of our unsuccessful attempts to make or maintain contact with the patient. The care management team is pleased to engage with this patient at any time in the future should he/she be interested in assistance from the care management team.   Follow Up Plan: We have been unable to make contact with the patient. The care management team is available to follow up with the patient after provider conversation with the patient regarding recommendation for care management engagement and subsequent re-referral to the care management team.   [facility] Guide, Embedded Care Coordination [facility] Health  Care Management  Direct Dial: [phone]

## 2021-04-18 ENCOUNTER — Other Ambulatory Visit: Payer: Self-pay

## 2021-04-18 ENCOUNTER — Ambulatory Visit (INDEPENDENT_AMBULATORY_CARE_PROVIDER_SITE_OTHER): Payer: Medicare Other | Admitting: Family Medicine

## 2021-04-18 VITALS — BP 154/70 | HR 92 | Temp 98.1°F | Resp 18 | Ht 70.0 in | Wt 183.0 lb

## 2021-04-18 DIAGNOSIS — E663 Overweight: Secondary | ICD-10-CM | POA: Diagnosis not present

## 2021-04-18 DIAGNOSIS — I10 Essential (primary) hypertension: Secondary | ICD-10-CM | POA: Diagnosis not present

## 2021-04-18 DIAGNOSIS — Z125 Encounter for screening for malignant neoplasm of prostate: Secondary | ICD-10-CM

## 2021-04-18 DIAGNOSIS — J3089 Other allergic rhinitis: Secondary | ICD-10-CM

## 2021-04-18 DIAGNOSIS — Z13220 Encounter for screening for lipoid disorders: Secondary | ICD-10-CM | POA: Diagnosis not present

## 2021-04-18 DIAGNOSIS — Z23 Encounter for immunization: Secondary | ICD-10-CM | POA: Diagnosis not present

## 2021-04-18 NOTE — Patient Instructions (Addendum)
Annual exam  with MD end October, call if you need me sooner   BP is high, please start taking the lisinopril/hctz which is prescribed every morning with the amlodipine, may take first lisinopril / hctz when you get  home  Continue doxazosin as before  Flu vaccine today  Fasting lipid, cmp and EGFR, PSA, about 5 days before next appt  covid booster  is due  Check on shingles vaccine, you need this  Thanks for choosing [facility], we consider it a privelige to serve you.

## 2021-04-19 ENCOUNTER — Encounter: Payer: Self-pay | Admitting: Family Medicine

## 2021-04-19 NOTE — Assessment & Plan Note (Signed)
Unconrolled, needs otake lisinopril/ hctz prescribed, has not been taking it thoi he has it , states was unaware that he needed it DASH diet and commitment to daily physical activity for a minimum of 30 minutes discussed and encouraged, as a part of hypertension management. The importance of attaining a healthy weight is also discussed.  BP/Weight 04/18/2021 10/12/2020 07/20/2020 05/10/2020 03/27/2020 07/09/2019 05/25/2019  Systolic BP 154 140 172 172 132 120 120  Diastolic BP 70 75 90 90 74 70 70  Wt. (Lbs) 183 185.8 188 188.04 184.12 179 179  BMI 26.26 26.66 26.98 26.98 26.42 28.89 28.89

## 2021-04-19 NOTE — Assessment & Plan Note (Signed)
Controlled, no current flare 

## 2021-04-19 NOTE — Assessment & Plan Note (Signed)
  Patient re-educated about  the importance of commitment to a  minimum of 150 minutes of exercise per week as able.  The importance of healthy food choices with portion control discussed, as well as eating regularly and within a 12 hour window most days. The need to choose "clean , green" food 50 to 75% of the time is discussed, as well as to make water the primary drink and set a goal of 64 ounces water daily.    Weight /BMI 04/18/2021 10/12/2020 07/20/2020  WEIGHT 183 lb 185 lb 12.8 oz 188 lb  HEIGHT 5\' 10"  5\' 10"  5\' 10"   BMI 26.26 kg/m2 26.66 kg/m2 26.98 kg/m2

## 2021-04-19 NOTE — Progress Notes (Signed)
   Bill Ward     MRN: [id number]      DOB: 01-27-1946   HPI Mr. Bahner is here for follow up and re-evaluation of chronic medical conditions, medication management and review of any available recent lab and radiology data.  Preventive health is updated, specifically  Cancer screening and Immunization.   Questions or concerns regarding consultations or procedures which the PT has had in the interim are  addressed. The PT denies any adverse reactions to current medications since the last visit.  There are no new concerns.  There are no specific complaints   ROS Denies recent fever or chills. Denies sinus pressure, nasal congestion, ear pain or sore throat. Denies chest congestion, productive cough or wheezing. Denies chest pains, palpitations and leg swelling Denies abdominal pain, nausea, vomiting,diarrhea or constipation.   Denies dysuria, frequency, hesitancy or incontinence. Denies joint pain, swelling and limitation in mobility. Denies headaches, seizures, numbness, or tingling. Denies depression, anxiety or insomnia. Denies skin break down or rash.   PE  BP (!) 154/70   Pulse 92   Temp 98.1 F (36.7 C)   Resp 18   Ht 5\' 10"  (1.778 m)   Wt 183 lb (83 kg)   SpO2 96%   BMI 26.26 kg/m   Patient alert and oriented and in no cardiopulmonary distress.  HEENT: No facial asymmetry, EOMI,     Neck supple .  Chest: Clear to auscultation bilaterally.  CVS: S1, S2 no murmurs, no S3.Regular rate.  ABD: Soft non tender.   Ext: No edema  MS: Adequate ROM spine, shoulders, hips and knees.  Skin: Intact, no ulcerations or rash noted.  Psych: Good eye contact, normal affect. Memory intact not anxious or depressed appearing.  CNS: CN 2-12 intact, power,  normal throughout.no focal deficits noted.   Assessment & Plan Essential hypertension Unconrolled, needs otake lisinopril/ hctz prescribed, has not been taking it thoi he has it , states was unaware that he needed  it DASH diet and commitment to daily physical activity for a minimum of 30 minutes discussed and encouraged, as a part of hypertension management. The importance of attaining a healthy weight is also discussed.  BP/Weight 04/18/2021 10/12/2020 07/20/2020 05/10/2020 03/27/2020 07/09/2019 05/25/2019  Systolic BP 154 140 172 172 132 120 120  Diastolic BP 70 75 90 90 74 70 70  Wt. (Lbs) 183 185.8 188 188.04 184.12 179 179  BMI 26.26 26.66 26.98 26.98 26.42 28.89 28.89       Overweight (BMI 25.0-29.9)  Patient re-educated about  the importance of commitment to a  minimum of 150 minutes of exercise per week as able.  The importance of healthy food choices with portion control discussed, as well as eating regularly and within a 12 hour window most days. The need to choose "clean , green" food 50 to 75% of the time is discussed, as well as to make water the primary drink and set a goal of 64 ounces water daily.    Weight /BMI 04/18/2021 10/12/2020 07/20/2020  WEIGHT 183 lb 185 lb 12.8 oz 188 lb  HEIGHT 5\' 10"  5\' 10"  5\' 10"   BMI 26.26 kg/m2 26.66 kg/m2 26.98 kg/m2      Allergic rhinitis Controlled, no current flare

## 2021-05-08 ENCOUNTER — Other Ambulatory Visit: Payer: Self-pay | Admitting: Family Medicine

## 2021-05-08 DIAGNOSIS — I10 Essential (primary) hypertension: Secondary | ICD-10-CM

## 2021-05-23 DIAGNOSIS — I10 Essential (primary) hypertension: Secondary | ICD-10-CM | POA: Diagnosis not present

## 2021-05-23 DIAGNOSIS — Z13220 Encounter for screening for lipoid disorders: Secondary | ICD-10-CM | POA: Diagnosis not present

## 2021-05-24 ENCOUNTER — Other Ambulatory Visit: Payer: Self-pay

## 2021-05-24 DIAGNOSIS — I10 Essential (primary) hypertension: Secondary | ICD-10-CM

## 2021-05-24 LAB — CMP14+EGFR
ALT: 11 IU/L (ref 0–44)
AST: 20 IU/L (ref 0–40)
Albumin/Globulin Ratio: 1.5 (ref 1.2–2.2)
Albumin: 4.4 g/dL (ref 3.7–4.7)
Alkaline Phosphatase: 59 IU/L (ref 44–121)
BUN/Creatinine Ratio: 23 (ref 10–24)
BUN: 33 mg/dL — ABNORMAL HIGH (ref 8–27)
Bilirubin Total: 0.6 mg/dL (ref 0.0–1.2)
CO2: 21 mmol/L (ref 20–29)
Calcium: 9.9 mg/dL (ref 8.6–10.2)
Chloride: 100 mmol/L (ref 96–106)
Creatinine, Ser: 1.43 mg/dL — ABNORMAL HIGH (ref 0.76–1.27)
Globulin, Total: 3 g/dL (ref 1.5–4.5)
Glucose: 108 mg/dL — ABNORMAL HIGH (ref 70–99)
Potassium: 5.4 mmol/L — ABNORMAL HIGH (ref 3.5–5.2)
Sodium: 137 mmol/L (ref 134–144)
Total Protein: 7.4 g/dL (ref 6.0–8.5)
eGFR: 50 mL/min/{1.73_m2} — ABNORMAL LOW (ref >59)

## 2021-05-24 LAB — LIPID PANEL
Chol/HDL Ratio: 3.1 ratio (ref 0.0–5.0)
Cholesterol, Total: 160 mg/dL (ref 100–199)
HDL: 51 mg/dL (ref >39)
LDL Chol Calc (NIH): 97 mg/dL (ref 0–99)
Triglycerides: 62 mg/dL (ref 0–149)
VLDL Cholesterol Cal: 12 mg/dL (ref 5–40)

## 2021-05-24 LAB — PSA: Prostate Specific Ag, Serum: 1.9 ng/mL (ref 0.0–4.0)

## 2021-06-05 DIAGNOSIS — I10 Essential (primary) hypertension: Secondary | ICD-10-CM | POA: Diagnosis not present

## 2021-06-06 LAB — BMP8+EGFR
BUN/Creatinine Ratio: 15 (ref 10–24)
BUN: 23 mg/dL (ref 8–27)
CO2: 24 mmol/L (ref 20–29)
Calcium: 9.8 mg/dL (ref 8.6–10.2)
Chloride: 100 mmol/L (ref 96–106)
Creatinine, Ser: 1.53 mg/dL — ABNORMAL HIGH (ref 0.76–1.27)
Glucose: 105 mg/dL — ABNORMAL HIGH (ref 70–99)
Potassium: 5.9 mmol/L — ABNORMAL HIGH (ref 3.5–5.2)
Sodium: 136 mmol/L (ref 134–144)
eGFR: 47 mL/min/{1.73_m2} — ABNORMAL LOW (ref >59)

## 2021-06-07 ENCOUNTER — Encounter: Payer: Self-pay | Admitting: Family Medicine

## 2021-06-07 ENCOUNTER — Ambulatory Visit (INDEPENDENT_AMBULATORY_CARE_PROVIDER_SITE_OTHER): Payer: Medicare Other | Admitting: Family Medicine

## 2021-06-07 ENCOUNTER — Other Ambulatory Visit: Payer: Self-pay

## 2021-06-07 VITALS — BP 128/82 | HR 95 | Resp 16 | Ht 70.0 in | Wt 175.0 lb

## 2021-06-07 DIAGNOSIS — Z0000 Encounter for general adult medical examination without abnormal findings: Secondary | ICD-10-CM

## 2021-06-07 DIAGNOSIS — N1831 Chronic kidney disease, stage 3a: Secondary | ICD-10-CM

## 2021-06-07 DIAGNOSIS — N183 Chronic kidney disease, stage 3 unspecified: Secondary | ICD-10-CM | POA: Insufficient documentation

## 2021-06-07 NOTE — Patient Instructions (Signed)
F/U in 6 months, call if you need me before  You are referred to Dr Gracelyn Nurse , kidney  Specialist  for chronic kidney disease  It is important that you exercise regularly at least 30 minutes 5 times a week. If you develop chest pain, have severe difficulty breathing, or feel very tired, stop exercising immediately and seek medical attention   Thanks for choosing [facility], we consider it a privelige to serve you.

## 2021-06-07 NOTE — Assessment & Plan Note (Signed)

## 2021-06-07 NOTE — Progress Notes (Signed)
   Bill Ward     MRN: [id number]      DOB: Mar 07, 1946   HPI: Patient is in for annual physical exam. No other health concerns are expressed or addressed at the visit. Recent labs, if available are reviewed. Immunization is reviewed , and  updated if needed.    PE; BP 128/82   Pulse 95   Resp 16   Ht 5\' 10"  (1.778 m)   Wt 175 lb (79.4 kg)   SpO2 95%   BMI 25.11 kg/m    Pleasant male, alert and oriented x 3, in no cardio-pulmonary distress. Afebrile. HEENT No facial trauma or asymetry. Sinuses non tender. EOMI External ears normal,  Neck: supple, no adenopathy,JVD or thyromegaly.No bruits.  Chest: Clear to ascultation bilaterally.No crackles or wheezes. Non tender to palpation  Cardiovascular system; Heart sounds normal,  S1 and  S2 ,no S3.  No murmur, or thrill. Apical beat not displaced Peripheral pulses normal.  Abdomen: Soft, non tender, no organomegaly or masses. No bruits. Bowel sounds normal. No guarding, tenderness or rebound.    Musculoskeletal exam: Full ROM of spine, hips , shoulders and knees. No deformity ,swelling or crepitus noted. No muscle wasting or atrophy.   Neurologic: Cranial nerves 2 to 12 intact. Power, tone ,sensation and reflexes normal throughout. No disturbance in gait. No tremor.  Skin: Intact, no ulceration, erythema , scaling or rash noted. Pigmentation normal throughout  Psych; Normal mood and affect. Judgement and concentration normal   A/P Annual physical exam Annual exam as documented. Counseling done  re healthy lifestyle involving commitment to 150 minutes exercise per week, heart healthy diet, and attaining healthy weight.The importance of adequate sleep also discussed. Regular seat belt use and home safety, is also discussed. Changes in health habits are decided on by the patient with goals and time frames  set for achieving them. Immunization and cancer screening needs are specifically addressed at this  visit.   CKD (chronic kidney disease) stage 3, GFR 30-59 ml/min [facility]) Refer Nephrology

## 2021-06-07 NOTE — Assessment & Plan Note (Signed)
Refer Nephrology °

## 2021-07-24 ENCOUNTER — Encounter: Payer: Medicare Other | Admitting: Family Medicine

## 2021-07-25 ENCOUNTER — Other Ambulatory Visit: Payer: Self-pay

## 2021-07-25 ENCOUNTER — Ambulatory Visit (INDEPENDENT_AMBULATORY_CARE_PROVIDER_SITE_OTHER): Payer: Medicare Other | Admitting: *Deleted

## 2021-07-25 DIAGNOSIS — Z0000 Encounter for general adult medical examination without abnormal findings: Secondary | ICD-10-CM

## 2021-07-25 NOTE — Patient Instructions (Signed)
Bill Ward , Thank you for taking time to come for your Medicare Wellness Visit. I appreciate your ongoing commitment to your health goals. Please review the following plan we discussed and let me know if I can assist you in the future.   Screening recommendations/referrals: Recommended yearly ophthalmology/optometry visit for glaucoma screening and checkup Recommended yearly dental visit for hygiene and checkup  Vaccinations: Influenza vaccine: completed Pneumococcal vaccine: completed Tdap vaccine: due now  Shingles vaccine: completed 1 is scheduled for 2nd    Advanced directives: information provided  Conditions/risks identified: hypertension  Next appointment: 1 year  Preventive Care 77 Years and Older, Male Preventive care refers to lifestyle choices and visits with your health care provider that can promote health and wellness. What does preventive care include? A yearly physical exam. This is also called an annual well check. Dental exams once or twice a year. Routine eye exams. Ask your health care provider how often you should have your eyes checked. Personal lifestyle choices, including: Daily care of your teeth and gums. Regular physical activity. Eating a healthy diet. Avoiding tobacco and drug use. Limiting alcohol use. Practicing safe sex. Taking low doses of aspirin every day. Taking vitamin and mineral supplements as recommended by your health care provider. What happens during an annual well check? The services and screenings done by your health care provider during your annual well check will depend on your age, overall health, lifestyle risk factors, and family history of disease. Counseling  Your health care provider may ask you questions about your: Alcohol use. Tobacco use. Drug use. Emotional well-being. Home and relationship well-being. Sexual activity. Eating habits. History of falls. Memory and ability to understand (cognition). Work and work  Astronomer. Screening  You may have the following tests or measurements: Height, weight, and BMI. Blood pressure. Lipid and cholesterol levels. These may be checked every 5 years, or more frequently if you are over 66 years old. Skin check. Lung cancer screening. You may have this screening every year starting at age 71 if you have a 30-pack-year history of smoking and currently smoke or have quit within the past 15 years. Fecal occult blood test (FOBT) of the stool. You may have this test every year starting at age 67. Flexible sigmoidoscopy or colonoscopy. You may have a sigmoidoscopy every 5 years or a colonoscopy every 10 years starting at age 78. Prostate cancer screening. Recommendations will vary depending on your family history and other risks. Hepatitis C blood test. Hepatitis B blood test. Sexually transmitted disease (STD) testing. Diabetes screening. This is done by checking your blood sugar (glucose) after you have not eaten for a while (fasting). You may have this done every 1-3 years. Abdominal aortic aneurysm (AAA) screening. You may need this if you are a current or former smoker. Osteoporosis. You may be screened starting at age 86 if you are at high risk. Talk with your health care provider about your test results, treatment options, and if necessary, the need for more tests. Vaccines  Your health care provider may recommend certain vaccines, such as: Influenza vaccine. This is recommended every year. Tetanus, diphtheria, and acellular pertussis (Tdap, Td) vaccine. You may need a Td booster every 10 years. Zoster vaccine. You may need this after age 3. Pneumococcal 13-valent conjugate (PCV13) vaccine. One dose is recommended after age 46. Pneumococcal polysaccharide (PPSV23) vaccine. One dose is recommended after age 80. Talk to your health care provider about which screenings and vaccines you need and how often you  need them. This information is not intended to replace  advice given to you by your health care provider. Make sure you discuss any questions you have with your health care provider. Document Released: 08/18/2015 Document Revised: 04/10/2016 Document Reviewed: 05/23/2015 Elsevier Interactive Patient Education  2017 [place] Prevention in the Home Falls can cause injuries. They can happen to people of all ages. There are many things you can do to make your home safe and to help prevent falls. What can I do on the outside of my home? Regularly fix the edges of walkways and driveways and fix any cracks. Remove anything that might make you trip as you walk through a door, such as a raised step or threshold. Trim any bushes or trees on the path to your home. Use bright outdoor lighting. Clear any walking paths of anything that might make someone trip, such as rocks or tools. Regularly check to see if handrails are loose or broken. Make sure that both sides of any steps have handrails. Any raised decks and porches should have guardrails on the edges. Have any leaves, snow, or ice cleared regularly. Use sand or salt on walking paths during winter. Clean up any spills in your garage right away. This includes oil or grease spills. What can I do in the bathroom? Use night lights. Install grab bars by the toilet and in the tub and shower. Do not use towel bars as grab bars. Use non-skid mats or decals in the tub or shower. If you need to sit down in the shower, use a plastic, non-slip stool. Keep the floor dry. Clean up any water that spills on the floor as soon as it happens. Remove soap buildup in the tub or shower regularly. Attach bath mats securely with double-sided non-slip rug tape. Do not have throw rugs and other things on the floor that can make you trip. What can I do in the bedroom? Use night lights. Make sure that you have a light by your bed that is easy to reach. Do not use any sheets or blankets that are too big for your bed.  They should not hang down onto the floor. Have a firm chair that has side arms. You can use this for support while you get dressed. Do not have throw rugs and other things on the floor that can make you trip. What can I do in the kitchen? Clean up any spills right away. Avoid walking on wet floors. Keep items that you use a lot in easy-to-reach places. If you need to reach something above you, use a strong step stool that has a grab bar. Keep electrical cords out of the way. Do not use floor polish or wax that makes floors slippery. If you must use wax, use non-skid floor wax. Do not have throw rugs and other things on the floor that can make you trip. What can I do with my stairs? Do not leave any items on the stairs. Make sure that there are handrails on both sides of the stairs and use them. Fix handrails that are broken or loose. Make sure that handrails are as long as the stairways. Check any carpeting to make sure that it is firmly attached to the stairs. Fix any carpet that is loose or worn. Avoid having throw rugs at the top or bottom of the stairs. If you do have throw rugs, attach them to the floor with carpet tape. Make sure that you have a light switch  at the top of the stairs and the bottom of the stairs. If you do not have them, ask someone to add them for you. What else can I do to help prevent falls? Wear shoes that: Do not have high heels. Have rubber bottoms. Are comfortable and fit you well. Are closed at the toe. Do not wear sandals. If you use a stepladder: Make sure that it is fully opened. Do not climb a closed stepladder. Make sure that both sides of the stepladder are locked into place. Ask someone to hold it for you, if possible. Clearly mark and make sure that you can see: Any grab bars or handrails. First and last steps. Where the edge of each step is. Use tools that help you move around (mobility aids) if they are needed. These  include: Canes. Walkers. Scooters. Crutches. Turn on the lights when you go into a dark area. Replace any light bulbs as soon as they burn out. Set up your furniture so you have a clear path. Avoid moving your furniture around. If any of your floors are uneven, fix them. If there are any pets around you, be aware of where they are. Review your medicines with your doctor. Some medicines can make you feel dizzy. This can increase your chance of falling. Ask your doctor what other things that you can do to help prevent falls. This information is not intended to replace advice given to you by your health care provider. Make sure you discuss any questions you have with your health care provider. Document Released: 05/18/2009 Document Revised: 12/28/2015 Document Reviewed: 08/26/2014 Elsevier Interactive Patient Education  2017 [employer].

## 2021-07-25 NOTE — Progress Notes (Signed)
Subjective:   Bill Ward is a 75 y.o. male who presents for Medicare Annual/Subsequent preventive examination.  I connected with  Bill Ward on 07/25/21 by a audio enabled telemedicine application and verified that I am speaking with the correct person using two identifiers.  Patient Location: Home  Provider Location: Office/Clinic  I discussed the limitations of evaluation and management by telemedicine. The patient expressed understanding and agreed to proceed.   Review of Systems     Bill Ward , Thank you for taking time to come for your Medicare Wellness Visit. I appreciate your ongoing commitment to your health goals. Please review the following plan we discussed and let me know if I can assist you in the future.   These are the goals we discussed:  Goals      DIET - INCREASE WATER INTAKE        This is a list of the screening recommended for you and due dates:  Health Maintenance  Topic Date Due   Zoster (Shingles) Vaccine (1 of 2) Never done   COVID-19 Vaccine (3 - Booster for Moderna series) 01/01/2020   Tetanus Vaccine  03/27/2024*   Pneumonia Vaccine  Completed   Flu Shot  Completed   Hepatitis C Screening: USPSTF Recommendation to screen - Ages 18-79 yo.  Completed   HPV Vaccine  Aged Out   Colon Cancer Screening  Discontinued  *Topic was postponed. The date shown is not the original due date.          Objective:    There were no vitals filed for this visit. There is no height or weight on file to calculate BMI.  Advanced Directives 07/20/2020 07/07/2018 06/30/2017 01/02/2017  Does Patient Have a Medical Advance Directive? No No No No  Would patient like information on creating a medical advance directive? No - Patient declined No - Patient declined No - Patient declined No - Patient declined    Current Medications (verified) Outpatient Encounter Medications as of 07/25/2021  Medication Sig   amLODipine (NORVASC) 10 MG tablet TAKE 1  TABLET BY MOUTH  DAILY   doxazosin (CARDURA) 4 MG tablet TAKE 1 TABLET BY MOUTH  DAILY   lisinopril-hydrochlorothiazide (ZESTORETIC) 20-12.5 MG tablet TAKE 1 TABLET BY MOUTH  DAILY   No facility-administered encounter medications on file as of 07/25/2021.    Allergies (verified) Patient has no known allergies.   History: Past Medical History:  Diagnosis Date   Allergy    Erectile dysfunction    Essential hypertension, benign    Past Surgical History:  Procedure Laterality Date   COLONOSCOPY     COLONOSCOPY N/A 01/02/2017   Procedure: COLONOSCOPY;  Surgeon: Rogene Houston, MD;  Location[facility];  Service: Endoscopy;  Laterality: N/A;  76   Family History  Problem Relation Age of Onset   Hypertension Mother    Cancer Father        bone   Diabetes Sister    Hypertension Sister    Hypertension Brother    Colon cancer Neg Hx    Social History   Socioeconomic History   Marital status: Single    Spouse name: Not on file   Number of children: 0   Years of education: 11   Highest education level: 11th grade  Occupational History   Occupation: retired   Tobacco Use   Smoking status: Former    Packs/day: 0.50    Years: 35.00    Pack years: 17.50  Types: Cigarettes   Smokeless tobacco: Never  Vaping Use   Vaping Use: Never used  Substance and Sexual Activity   Alcohol use: No   Drug use: No   Sexual activity: Yes  Other Topics Concern   Not on file  Social History Narrative   Lives with sister and a brother    Social Determinants of Health   Financial Resource Strain: Not on file  Food Insecurity: Not on file  Transportation Needs: Not on file  Physical Activity: Not on file  Stress: Not on file  Social Connections: Not on file    Tobacco Counseling Counseling given: Not Answered   Clinical Intake:                 Diabetic?no         Activities of Daily Living No flowsheet data found.  Patient Care Team: Fayrene Helper, MD as PCP - General  Indicate any recent Medical Services you may have received from other than [facility] providers in the past year (date may be approximate).     Assessment:   This is a routine wellness examination for Bill Ward.  Hearing/Vision screen No results found.  Dietary issues and exercise activities discussed:     Goals Addressed   None   Depression Screen PHQ 2/9 Scores 06/07/2021 04/18/2021 10/12/2020 07/20/2020 05/10/2020 03/27/2020 07/09/2019  PHQ - 2 Score 0 0 0 0 0 0 0  Exception Documentation - - - - - Medical reason -    Fall Risk Fall Risk  06/07/2021 04/18/2021 10/12/2020 07/20/2020 05/10/2020  Falls in the past year? 0 0 0 0 0  Number falls in past yr: 0 0 0 0 0  Injury with Fall? 0 0 0 0 0  Risk for fall due to : - - No Fall Risks No Fall Risks -  Follow up - - Falls evaluation completed Falls evaluation completed Falls evaluation completed    [place]:  Any stairs in or around the home? No  If so, are there any without handrails? No  Home free of loose throw rugs in walkways, pet beds, electrical cords, etc? Yes  Adequate lighting in your home to reduce risk of falls? Yes   ASSISTIVE DEVICES UTILIZED TO PREVENT FALLS:  Life alert? No  Use of a cane, walker or w/c? No  Grab bars in the bathroom? No  Shower chair or bench in shower? No  Elevated toilet seat or a handicapped toilet? No    Cognitive Function:     6CIT Screen 07/20/2020 07/09/2019 07/07/2018 06/30/2017  What Year? 0 points 0 points 0 points 4 points  What month? 0 points 0 points 0 points 0 points  What time? 0 points 0 points 0 points 3 points  Count back from 20 4 points 0 points 4 points 4 points  Months in reverse 4 points 4 points 0 points 4 points  Repeat phrase 0 points 10 points 0 points 4 points  Total Score 8 14 4 19     Immunizations Immunization History  Administered Date(s) Administered   Fluad Quad(high Dose 65+) 03/24/2019,  04/18/2021   Influenza Split 07/09/2011, 05/07/2012   Influenza Whole 05/14/2005, 06/19/2009, 06/26/2010   Influenza,inj,Quad PF,6+ Mos 05/12/2013, 05/23/2014, 05/01/2015, 05/13/2016, 04/16/2017, 05/27/2018, 03/27/2020   Moderna Sars-Covid-2 Vaccination 10/09/2019, 11/06/2019   Pneumococcal Conjugate-13 03/22/2014   Pneumococcal Polysaccharide-23 02/07/2009   Td 11/08/2004    TDAP status: Due, Education has been provided regarding the importance  of this vaccine. Advised may receive this vaccine at local pharmacy or Health Dept. Aware to provide a copy of the vaccination record if obtained from local pharmacy or Health Dept. Verbalized acceptance and understanding.  Flu Vaccine status: Up to date  Pneumococcal vaccine status: Up to date  Covid-19 vaccine status: Completed vaccines  Qualifies for Shingles Vaccine? Yes   Zostavax completed Yes  Shingrix Completed?: No.    Education has been provided regarding the importance of this vaccine. Patient has been advised to call insurance company to determine out of pocket expense if they have not yet received this vaccine. Advised may also receive vaccine at local pharmacy or Health Dept. Verbalized acceptance and understanding.  Screening Tests Health Maintenance  Topic Date Due   Zoster Vaccines- Shingrix (1 of 2) Never done   COVID-19 Vaccine (3 - Booster for Moderna series) 01/01/2020   TETANUS/TDAP  03/27/2024 (Originally 11/09/2014)   Pneumonia Vaccine 58+ Years old  Completed   INFLUENZA VACCINE  Completed   Hepatitis C Screening  Completed   HPV VACCINES  Aged Out   COLONOSCOPY (Pts 45-10yrs Insurance coverage will need to be confirmed)  Discontinued    Health Maintenance  Health Maintenance Due  Topic Date Due   Zoster Vaccines- Shingrix (1 of 2) Never done   COVID-19 Vaccine (3 - Booster for Moderna series) 01/01/2020    Colorectal cancer screening: No longer required.   Lung Cancer Screening: (Low Dose CT Chest  recommended if Age 75-80 years, 30 pack-year currently smoking OR have quit w/in 15years.) does not qualify.    Additional Screening:  Hepatitis C Screening: does qualify; Completed 08-08-15  Vision Screening: Recommended annual ophthalmology exams for early detection of glaucoma and other disorders of the eye. Is the patient up to date with their annual eye exam?  Yes  Who is the provider or what is the name of the office in which the patient attends annual eye exams? [facility]  If pt is not established with a provider, would they like to be referred to a provider to establish care? No .   Dental Screening: Recommended annual dental exams for proper oral hygiene  Community Resource Referral / Chronic Care Management: CRR required this visit?  No   CCM required this visit?  No      Plan:     I have personally reviewed and noted the following in the patients chart:   Medical and social history Use of alcohol, tobacco or illicit drugs  Current medications and supplements including opioid prescriptions. Patient is not currently taking opioid prescriptions. Functional ability and status Nutritional status Physical activity Advanced directives List of other physicians Hospitalizations, surgeries, and ER visits in previous 12 months Vitals Screenings to include cognitive, depression, and falls Referrals and appointments  In addition, I have reviewed and discussed with patient certain preventive protocols, quality metrics, and best practice recommendations. A written personalized care plan for preventive services as well as general preventive health recommendations were provided to patient.     Bill Ward, CMA   07/25/2021   Nurse Notes:  Bill Ward , Thank you for taking time to come for your Medicare Wellness Visit. I appreciate your ongoing commitment to your health goals. Please review the following plan we discussed and let me know if I can assist you in  the future.   These are the goals we discussed:  Goals      DIET - INCREASE WATER INTAKE  This is a list of the screening recommended for you and due dates:  Health Maintenance  Topic Date Due   Zoster (Shingles) Vaccine (1 of 2) Never done   COVID-19 Vaccine (3 - Booster for Moderna series) 01/01/2020   Tetanus Vaccine  03/27/2024*   Pneumonia Vaccine  Completed   Flu Shot  Completed   Hepatitis C Screening: USPSTF Recommendation to screen - Ages 18-79 yo.  Completed   HPV Vaccine  Aged Out   Colon Cancer Screening  Discontinued  *Topic was postponed. The date shown is not the original due date.

## 2021-09-04 ENCOUNTER — Other Ambulatory Visit (HOSPITAL_COMMUNITY): Payer: Self-pay | Admitting: Nephrology

## 2021-09-04 DIAGNOSIS — E875 Hyperkalemia: Secondary | ICD-10-CM | POA: Diagnosis not present

## 2021-09-04 DIAGNOSIS — N1831 Chronic kidney disease, stage 3a: Secondary | ICD-10-CM

## 2021-09-04 DIAGNOSIS — D638 Anemia in other chronic diseases classified elsewhere: Secondary | ICD-10-CM

## 2021-09-04 DIAGNOSIS — I129 Hypertensive chronic kidney disease with stage 1 through stage 4 chronic kidney disease, or unspecified chronic kidney disease: Secondary | ICD-10-CM | POA: Diagnosis not present

## 2021-09-04 DIAGNOSIS — N183 Chronic kidney disease, stage 3 unspecified: Secondary | ICD-10-CM

## 2021-09-10 DIAGNOSIS — N1831 Chronic kidney disease, stage 3a: Secondary | ICD-10-CM | POA: Diagnosis not present

## 2021-09-10 DIAGNOSIS — I129 Hypertensive chronic kidney disease with stage 1 through stage 4 chronic kidney disease, or unspecified chronic kidney disease: Secondary | ICD-10-CM | POA: Diagnosis not present

## 2021-09-10 DIAGNOSIS — D638 Anemia in other chronic diseases classified elsewhere: Secondary | ICD-10-CM | POA: Diagnosis not present

## 2021-09-10 DIAGNOSIS — E875 Hyperkalemia: Secondary | ICD-10-CM | POA: Diagnosis not present

## 2021-09-12 ENCOUNTER — Other Ambulatory Visit: Payer: Self-pay

## 2021-09-12 ENCOUNTER — Ambulatory Visit (HOSPITAL_COMMUNITY)
Admission: RE | Admit: 2021-09-12 | Discharge: 2021-09-12 | Disposition: A | Discharge status: Home / Self Care | Payer: Medicare Other | Source: Ambulatory Visit | Attending: Nephrology | Admitting: Nephrology

## 2021-09-12 DIAGNOSIS — E875 Hyperkalemia: Secondary | ICD-10-CM | POA: Diagnosis not present

## 2021-09-12 DIAGNOSIS — I129 Hypertensive chronic kidney disease with stage 1 through stage 4 chronic kidney disease, or unspecified chronic kidney disease: Secondary | ICD-10-CM | POA: Diagnosis not present

## 2021-09-12 DIAGNOSIS — N1831 Chronic kidney disease, stage 3a: Secondary | ICD-10-CM | POA: Insufficient documentation

## 2021-09-12 DIAGNOSIS — D638 Anemia in other chronic diseases classified elsewhere: Secondary | ICD-10-CM | POA: Insufficient documentation

## 2021-09-12 DIAGNOSIS — N183 Chronic kidney disease, stage 3 unspecified: Secondary | ICD-10-CM | POA: Diagnosis not present

## 2021-09-12 DIAGNOSIS — N189 Chronic kidney disease, unspecified: Secondary | ICD-10-CM | POA: Diagnosis not present

## 2021-10-10 DIAGNOSIS — R894 Abnormal immunological findings in specimens from other organs, systems and tissues: Secondary | ICD-10-CM | POA: Diagnosis not present

## 2021-10-10 DIAGNOSIS — E559 Vitamin D deficiency, unspecified: Secondary | ICD-10-CM | POA: Diagnosis not present

## 2021-10-10 DIAGNOSIS — N1831 Chronic kidney disease, stage 3a: Secondary | ICD-10-CM | POA: Diagnosis not present

## 2021-10-10 DIAGNOSIS — I129 Hypertensive chronic kidney disease with stage 1 through stage 4 chronic kidney disease, or unspecified chronic kidney disease: Secondary | ICD-10-CM | POA: Diagnosis not present

## 2021-10-10 DIAGNOSIS — D638 Anemia in other chronic diseases classified elsewhere: Secondary | ICD-10-CM | POA: Diagnosis not present

## 2021-12-05 ENCOUNTER — Encounter: Payer: Self-pay | Admitting: Family Medicine

## 2021-12-05 ENCOUNTER — Ambulatory Visit (INDEPENDENT_AMBULATORY_CARE_PROVIDER_SITE_OTHER): Payer: Medicare Other | Admitting: Family Medicine

## 2021-12-05 VITALS — BP 190/90 | HR 66 | Ht 70.0 in | Wt 173.1 lb

## 2021-12-05 DIAGNOSIS — I10 Essential (primary) hypertension: Secondary | ICD-10-CM

## 2021-12-05 DIAGNOSIS — N1831 Chronic kidney disease, stage 3a: Secondary | ICD-10-CM

## 2021-12-05 NOTE — Patient Instructions (Signed)
F/u in mid August, call if you need me sooner ? ?Resume amlodipine 10 mg onc edaily for your blood pressure, which is very high today ?Continue carvedilol prescribed by Nephrology ? ?Eat mainly fresh or frozen vegetables and fruit and grilled or baked chicken , fish , and beans ? ?It is important that you exercise regularly at least 30 minutes 5 times a week. If you develop chest pain, have severe difficulty breathing, or feel very tired, stop exercising immediately and seek medical attention  ? ? ?Thanks for choosing [facility], we consider it a privelige to serve you. ? ?

## 2021-12-05 NOTE — Assessment & Plan Note (Addendum)
Uncontrolled, resume anlodipine 10 mg once daily, will try tpo speak with  Nepohrology also ?DASH diet and commitment to daily physical activity for a minimum of 30 minutes discussed and encouraged, as a part of hypertension management. ?The importance of attaining a healthy weight is also discussed. ?Has upcoming appt with nephrology ? ? ?  12/05/2021  ?  9:08 AM 06/07/2021  ?  2:17 PM 06/07/2021  ?  2:03 PM 04/18/2021  ?  3:12 PM 10/12/2020  ?  1:56 PM 07/20/2020  ? 10:59 AM 05/10/2020  ?  1:03 PM  ?BP/Weight  ?Systolic BP 192 128 138 154 140 172 172  ?Diastolic BP 79 82 63 70 75 90 90  ?Wt. (Lbs) 173.08  175 183 185.8 188 188.04  ?BMI 24.83 kg/m2  25.11 kg/m2 26.26 kg/m2 26.66 kg/m2 26.98 kg/m2 26.98 kg/m2  ? ? ? ? ?

## 2021-12-09 ENCOUNTER — Encounter: Payer: Self-pay | Admitting: Family Medicine

## 2021-12-09 NOTE — Assessment & Plan Note (Signed)
Being evaluated n managed by Nephrology ?

## 2021-12-09 NOTE — Progress Notes (Signed)
? ?  Bill Ward     MRN: [id number]      DOB: [birth year] ? ? ?HPI ?Bill Ward is here for follow up and re-evaluation of chronic medical conditions, medication management and review of any available recent lab and radiology data.  ?Preventive health is updated, specifically  Cancer screening and Immunization.   ?Questions or concerns regarding consultations or procedures which the PT has had in the interim are  addressed. ?Currently only on coreg , states he was advised to do this by Nephrology ?ROS ?Denies recent fever or chills. ?Denies sinus pressure, nasal congestion, ear pain or sore throat. ?Denies chest congestion, productive cough or wheezing. ?Denies chest pains, palpitations and leg swelling ?Denies abdominal pain, nausea, vomiting,diarrhea or constipation.   ?Denies dysuria, frequency, hesitancy or incontinence. ?Denies joint pain, swelling and limitation in mobility. ?Denies headaches, seizures, numbness, or tingling. ?Denies depression, anxiety or insomnia. ?Denies skin break down or rash. ? ? ?PE ? ?BP (!) 190/90   Pulse 66   Ht 5\' 10"  (1.778 m)   Wt 173 lb 1.3 oz (78.5 kg)   SpO2 95%   BMI 24.83 kg/m?  ? ?Patient alert and oriented and in no cardiopulmonary distress. ? ?HEENT: No facial asymmetry, EOMI,     Neck supple . ? ?Chest: Clear to auscultation bilaterally. ? ?CVS: S1, S2 no murmurs, no S3.Regular rate. ? ?ABD: Soft non tender.  ? ?Ext: No edema ? ?MS: Adequate ROM spine, shoulders, hips and knees. ? ?Skin: Intact, no ulcerations or rash noted. ? ?Psych: Good eye contact, normal affect. Memory intact not anxious or depressed appearing. ? ?CNS: CN 2-12 intact, power,  normal throughout.no focal deficits noted. ? ? ?Assessment & Plan ? ?Essential hypertension ?Uncontrolled, resume anlodipine 10 mg once daily, will try tpo speak with  Nepohrology also ?DASH diet and commitment to daily physical activity for a minimum of 30 minutes discussed and encouraged, as a part of hypertension  management. ?The importance of attaining a healthy weight is also discussed. ?Has upcoming appt with nephrology ? ? ?  12/05/2021  ?  9:08 AM 06/07/2021  ?  2:17 PM 06/07/2021  ?  2:03 PM 04/18/2021  ?  3:12 PM 10/12/2020  ?  1:56 PM 07/20/2020  ? 10:59 AM 05/10/2020  ?  1:03 PM  ?BP/Weight  ?Systolic BP [id number] 140 172 172  ?Diastolic BP 79 82 63 70 75 90 90  ?Wt. (Lbs) 173.08  175 183 185.8 188 188.04  ?BMI 24.83 kg/m2  25.11 kg/m2 26.26 kg/m2 26.66 kg/m2 26.98 kg/m2 26.98 kg/m2  ? ? ? ? ? ?CKD (chronic kidney disease) stage 3, GFR 30-59 ml/min (HCC) ?Being evaluated n managed by Nephrology ? ? ?

## 2022-01-04 DIAGNOSIS — E559 Vitamin D deficiency, unspecified: Secondary | ICD-10-CM | POA: Diagnosis not present

## 2022-01-04 DIAGNOSIS — D638 Anemia in other chronic diseases classified elsewhere: Secondary | ICD-10-CM | POA: Diagnosis not present

## 2022-01-04 DIAGNOSIS — I129 Hypertensive chronic kidney disease with stage 1 through stage 4 chronic kidney disease, or unspecified chronic kidney disease: Secondary | ICD-10-CM | POA: Diagnosis not present

## 2022-01-04 DIAGNOSIS — R894 Abnormal immunological findings in specimens from other organs, systems and tissues: Secondary | ICD-10-CM | POA: Diagnosis not present

## 2022-01-04 DIAGNOSIS — N1831 Chronic kidney disease, stage 3a: Secondary | ICD-10-CM | POA: Diagnosis not present

## 2022-01-10 DIAGNOSIS — E559 Vitamin D deficiency, unspecified: Secondary | ICD-10-CM | POA: Diagnosis not present

## 2022-01-10 DIAGNOSIS — N1831 Chronic kidney disease, stage 3a: Secondary | ICD-10-CM | POA: Diagnosis not present

## 2022-01-10 DIAGNOSIS — I129 Hypertensive chronic kidney disease with stage 1 through stage 4 chronic kidney disease, or unspecified chronic kidney disease: Secondary | ICD-10-CM | POA: Diagnosis not present

## 2022-01-10 DIAGNOSIS — D638 Anemia in other chronic diseases classified elsewhere: Secondary | ICD-10-CM | POA: Diagnosis not present

## 2022-02-02 ENCOUNTER — Other Ambulatory Visit: Payer: Self-pay | Admitting: Family Medicine

## 2022-02-12 ENCOUNTER — Other Ambulatory Visit: Payer: Self-pay | Admitting: Family Medicine

## 2022-02-12 DIAGNOSIS — I10 Essential (primary) hypertension: Secondary | ICD-10-CM

## 2022-03-20 ENCOUNTER — Ambulatory Visit (INDEPENDENT_AMBULATORY_CARE_PROVIDER_SITE_OTHER): Payer: Medicare Other | Admitting: Family Medicine

## 2022-03-20 ENCOUNTER — Encounter: Payer: Self-pay | Admitting: Family Medicine

## 2022-03-20 VITALS — BP 136/80 | HR 76 | Resp 16 | Ht 70.0 in | Wt 176.0 lb

## 2022-03-20 DIAGNOSIS — N1831 Chronic kidney disease, stage 3a: Secondary | ICD-10-CM | POA: Diagnosis not present

## 2022-03-20 DIAGNOSIS — Z13220 Encounter for screening for lipoid disorders: Secondary | ICD-10-CM

## 2022-03-20 DIAGNOSIS — E663 Overweight: Secondary | ICD-10-CM

## 2022-03-20 DIAGNOSIS — I10 Essential (primary) hypertension: Secondary | ICD-10-CM

## 2022-03-20 DIAGNOSIS — Z125 Encounter for screening for malignant neoplasm of prostate: Secondary | ICD-10-CM

## 2022-03-20 DIAGNOSIS — E559 Vitamin D deficiency, unspecified: Secondary | ICD-10-CM | POA: Diagnosis not present

## 2022-03-20 NOTE — Patient Instructions (Addendum)
Annual exam 11/04 or after, call if you need me sooner  Reduce salt and processed foods, blood pressure still a bit high   It is important that you exercise regularly at least 30 minutes 5 times a week. If you develop chest pain, have severe difficulty breathing, or feel very tired, stop exercising immediately and seek medical attention    Increase vegetables and fruits and reduce salt  Fasting lipid, cmp and EGFr, PSA, TSH and vit D, and cBC Oct 30 or after  Thanks for choosing [facility], we consider it a privelige to serve you.

## 2022-03-23 ENCOUNTER — Encounter: Payer: Self-pay | Admitting: Family Medicine

## 2022-03-23 NOTE — Assessment & Plan Note (Signed)
Followed by nephrology. 

## 2022-03-23 NOTE — Assessment & Plan Note (Signed)
DASH diet and commitment to daily physical activity for a minimum of 30 minutes discussed and encouraged, as a part of hypertension management. The importance of attaining a healthy weight is also discussed.     03/20/2022    9:48 AM 03/20/2022    9:37 AM 03/20/2022    9:18 AM 03/20/2022    8:59 AM 12/05/2021    9:42 AM 12/05/2021    9:08 AM 06/07/2021    2:17 PM  BP/Weight  Systolic BP 136 140 150 158 190 192 128  Diastolic BP 80 80 80 80 90 79 82  Wt. (Lbs)    176  173.08   BMI    25.25 kg/m2  24.83 kg/m2

## 2022-03-23 NOTE — Assessment & Plan Note (Signed)
  Patient re-educated about  the importance of commitment to a  minimum of 150 minutes of exercise per week as able.  The importance of healthy food choices with portion control discussed, as well as eating regularly and within a 12 hour window most days. The need to choose "clean , green" food 50 to 75% of the time is discussed, as well as to make water the primary drink and set a goal of 64 ounces water daily.       03/20/2022    8:59 AM 12/05/2021    9:08 AM 06/07/2021    2:03 PM  Weight /BMI  Weight 176 lb 173 lb 1.3 oz 175 lb  Height 5\' 10"  (1.778 m) 5\' 10"  (1.778 m) 5\' 10"  (1.778 m)  BMI 25.25 kg/m2 24.83 kg/m2 25.11 kg/m2

## 2022-03-23 NOTE — Progress Notes (Signed)
Bill Ward     MRN: [id number]      DOB: May 22, 1946   HPI Bill Ward is here for follow up and re-evaluation of chronic medical conditions, medication management and review of any available recent lab and radiology data.  Preventive health is updated, specifically  Cancer screening and Immunization.   Questions or concerns regarding consultations or procedures which the PT has had in the interim are  addressed. The PT denies any adverse reactions to current medications since the last visit.  There are no new concerns.  There are no specific complaints   ROS Denies recent fever or chills. Denies sinus pressure, nasal congestion, ear pain or sore throat. Denies chest congestion, productive cough or wheezing. Denies chest pains, palpitations and leg swelling Denies abdominal pain, nausea, vomiting,diarrhea or constipation.   Denies dysuria, frequency, hesitancy or incontinence. Denies joint pain, swelling and limitation in mobility. Denies headaches, seizures, numbness, or tingling. Denies depression, anxiety or insomnia. Denies skin break down or rash.   PE  BP 136/80   Pulse 76   Resp 16   Ht 5\' 10"  (1.778 m)   Wt 176 lb (79.8 kg)   SpO2 95%   BMI 25.25 kg/m    Patient alert and oriented and in no cardiopulmonary distress.  HEENT: No facial asymmetry, EOMI,     Neck supple .  Chest: Clear to auscultation bilaterally.  CVS: S1, S2 no murmurs, no S3.Regular rate.  ABD: Soft non tender.   Ext: No edema  MS: Adequate ROM spine, shoulders, hips and knees.  Skin: Intact, no ulcerations or rash noted.  Psych: Good eye contact, normal affect. Memory intact not anxious or depressed appearing.  CNS: CN 2-12 intact, power,  normal throughout.no focal deficits noted.   Assessment & Plan  Essential hypertension DASH diet and commitment to daily physical activity for a minimum of 30 minutes discussed and encouraged, as a part of hypertension management. The  importance of attaining a healthy weight is also discussed.     03/20/2022    9:48 AM 03/20/2022    9:37 AM 03/20/2022    9:18 AM 03/20/2022    8:59 AM 12/05/2021    9:42 AM 12/05/2021    9:08 AM 06/07/2021    2:17 PM  BP/Weight  Systolic BP 136 140 150 158 190 192 128  Diastolic BP 80 80 80 80 90 79 82  Wt. (Lbs)    176  173.08   BMI    25.25 kg/m2  24.83 kg/m2        Overweight (BMI 25.0-29.9)  Patient re-educated about  the importance of commitment to a  minimum of 150 minutes of exercise per week as able.  The importance of healthy food choices with portion control discussed, as well as eating regularly and within a 12 hour window most days. The need to choose "clean , green" food 50 to 75% of the time is discussed, as well as to make water the primary drink and set a goal of 64 ounces water daily.       03/20/2022    8:59 AM 12/05/2021    9:08 AM 06/07/2021    2:03 PM  Weight /BMI  Weight 176 lb 173 lb 1.3 oz 175 lb  Height 5\' 10"  (1.778 m) 5\' 10"  (1.778 m) 5\' 10"  (1.778 m)  BMI 25.25 kg/m2 24.83 kg/m2 25.11 kg/m2      CKD (chronic kidney disease) stage 3, GFR 30-59 ml/min (HCC) Followed by nephrology

## 2022-04-12 DIAGNOSIS — E559 Vitamin D deficiency, unspecified: Secondary | ICD-10-CM | POA: Diagnosis not present

## 2022-04-12 DIAGNOSIS — I129 Hypertensive chronic kidney disease with stage 1 through stage 4 chronic kidney disease, or unspecified chronic kidney disease: Secondary | ICD-10-CM | POA: Diagnosis not present

## 2022-04-12 DIAGNOSIS — D638 Anemia in other chronic diseases classified elsewhere: Secondary | ICD-10-CM | POA: Diagnosis not present

## 2022-04-12 DIAGNOSIS — N1831 Chronic kidney disease, stage 3a: Secondary | ICD-10-CM | POA: Diagnosis not present

## 2022-04-15 DIAGNOSIS — N1831 Chronic kidney disease, stage 3a: Secondary | ICD-10-CM | POA: Diagnosis not present

## 2022-04-15 DIAGNOSIS — I129 Hypertensive chronic kidney disease with stage 1 through stage 4 chronic kidney disease, or unspecified chronic kidney disease: Secondary | ICD-10-CM | POA: Diagnosis not present

## 2022-04-15 DIAGNOSIS — D638 Anemia in other chronic diseases classified elsewhere: Secondary | ICD-10-CM | POA: Diagnosis not present

## 2022-04-15 DIAGNOSIS — E559 Vitamin D deficiency, unspecified: Secondary | ICD-10-CM | POA: Diagnosis not present

## 2022-04-18 DIAGNOSIS — I129 Hypertensive chronic kidney disease with stage 1 through stage 4 chronic kidney disease, or unspecified chronic kidney disease: Secondary | ICD-10-CM | POA: Diagnosis not present

## 2022-04-18 DIAGNOSIS — N1831 Chronic kidney disease, stage 3a: Secondary | ICD-10-CM | POA: Diagnosis not present

## 2022-04-18 DIAGNOSIS — E559 Vitamin D deficiency, unspecified: Secondary | ICD-10-CM | POA: Diagnosis not present

## 2022-04-18 DIAGNOSIS — D638 Anemia in other chronic diseases classified elsewhere: Secondary | ICD-10-CM | POA: Diagnosis not present

## 2022-05-06 ENCOUNTER — Ambulatory Visit (INDEPENDENT_AMBULATORY_CARE_PROVIDER_SITE_OTHER): Payer: Medicare Other

## 2022-05-06 DIAGNOSIS — Z23 Encounter for immunization: Secondary | ICD-10-CM | POA: Diagnosis not present

## 2022-05-17 ENCOUNTER — Telehealth: Payer: Self-pay | Admitting: Family Medicine

## 2022-05-17 NOTE — Telephone Encounter (Signed)
Pt called stating that he is getting dizzy when standing up & difficulty with rt leg. Pt wants to know what he needs to do? Asked for nurse to give him a call.

## 2022-05-20 ENCOUNTER — Encounter: Payer: Self-pay | Admitting: Internal Medicine

## 2022-05-20 ENCOUNTER — Ambulatory Visit (INDEPENDENT_AMBULATORY_CARE_PROVIDER_SITE_OTHER): Payer: Medicare Other | Admitting: Internal Medicine

## 2022-05-20 DIAGNOSIS — I10 Essential (primary) hypertension: Secondary | ICD-10-CM | POA: Diagnosis not present

## 2022-05-20 NOTE — Progress Notes (Signed)
     CC: dizziness  HPI:[name] is a 76 y.o. male who presents for evaluation of dizziness. For the details of today's visit, please refer to the assessment and plan.   Past Medical History:  Diagnosis Date   Allergy    Erectile dysfunction    Essential hypertension, benign     Review of Systems:    Review of Systems  Constitutional:  Negative for chills and fever.  Eyes:  Negative for blurred vision and double vision.  Neurological:  Positive for dizziness. Negative for focal weakness.     Physical Exam: Vitals:   05/20/22 0956  BP: 136/82  Pulse: 81  SpO2: 98%  Weight: 169 lb (76.7 kg)  Height: 5\' 9"  (1.753 m)     Physical Exam Constitutional:      Appearance: He is normal weight. He is not ill-appearing.  Cardiovascular:     Rate and Rhythm: Normal rate and regular rhythm.  Pulmonary:     Effort: Pulmonary effort is normal.     Breath sounds: Normal breath sounds.  Musculoskeletal:     Right lower leg: No edema.     Left lower leg: No edema.      Assessment & Plan:   Essential hypertension Patient came in for evaluation of dizziness.  The dizziness come on recently and is episodic.  Most of the time has time it is brought on by standing up. He has been losing weight by following a stricter diet and cutting out salt.  He stopped taking Cardura and the symptoms resolved. BP today 136/82. Goal <140/90.  Assessment/Plan: HTN, controlled on current regimen. Having some orthostatic hypotension while also on Cardura. Will stop Cardura. Continue Amlodopine 10 mg and Carvedilol 6.25 BID. Follow up with Dr.Simpson in November.   Lorene Dy, MD

## 2022-05-20 NOTE — Patient Instructions (Addendum)
Thank you for trusting me with your care. To recap, today we discussed the following:   Stop Cardura , also called Doxazosin Continue Amlodipine and Carvedilol If light headed anymore, stop Carvedilol and let me or Dr.Simpson know.  Follow up with Dr.Simpson in November

## 2022-05-20 NOTE — Assessment & Plan Note (Addendum)
Patient came in for evaluation of dizziness.  The dizziness come on recently and is episodic.  Most of the time has time it is brought on by standing up. He has been losing weight by following a stricter diet and cutting out salt.  He stopped taking Cardura and the symptoms resolved. BP today 136/82. Goal <140/90.  Assessment/Plan: HTN, controlled on current regimen. Having some orthostatic hypotension while also on Cardura. Will stop Cardura. Continue Amlodopine 10 mg and Carvedilol 6.25 BID. Follow up with Dr.Simpson in November.

## 2022-06-12 ENCOUNTER — Encounter: Payer: Self-pay | Admitting: Family Medicine

## 2022-06-12 ENCOUNTER — Ambulatory Visit (INDEPENDENT_AMBULATORY_CARE_PROVIDER_SITE_OTHER): Payer: Medicare Other | Admitting: Family Medicine

## 2022-06-12 VITALS — BP 150/70 | HR 91 | Ht 69.0 in | Wt 173.0 lb

## 2022-06-12 DIAGNOSIS — E559 Vitamin D deficiency, unspecified: Secondary | ICD-10-CM | POA: Diagnosis not present

## 2022-06-12 DIAGNOSIS — Z0000 Encounter for general adult medical examination without abnormal findings: Secondary | ICD-10-CM

## 2022-06-12 DIAGNOSIS — I10 Essential (primary) hypertension: Secondary | ICD-10-CM

## 2022-06-12 DIAGNOSIS — Z13220 Encounter for screening for lipoid disorders: Secondary | ICD-10-CM | POA: Diagnosis not present

## 2022-06-12 MED ORDER — HYDROCHLOROTHIAZIDE 12.5 MG PO CAPS: 12.5000 mg | ORAL_CAPSULE | Freq: Every day | ORAL | 2 refills | Status: DC

## 2022-06-12 NOTE — Assessment & Plan Note (Signed)

## 2022-06-12 NOTE — Progress Notes (Signed)
   Bill Ward     MRN: [id number]      DOB: May 12, 1946   HPI: Patient is in for annual physical exam. Uncontrolled hypertension is also addressed Recent labs, if available are reviewed. Immunization is reviewed , and  updated if needed.    PE; BP (!) 150/70   Pulse 91   Ht 5\' 9"  (1.753 m)   Wt 173 lb (78.5 kg)   SpO2 96%   BMI 25.55 kg/m   Pleasant male, alert and oriented x 3, in no cardio-pulmonary distress. Afebrile. HEENT No facial trauma or asymetry. Sinuses non tender. EOMI External ears normal,  Neck: supple, no adenopathy,JVD or thyromegaly.No bruits.  Chest: Clear to ascultation bilaterally.No crackles or wheezes. Non tender to palpation  Cardiovascular system; Heart sounds normal,  S1 and  S2 ,no S3.  No murmur, or thrill. Apical beat not displaced Peripheral pulses normal.  Abdomen: Soft, non tender, no organomegaly or masses. No bruits. Bowel sounds normal. No guarding, tenderness or rebound.    Musculoskeletal exam: Full ROM of spine, hips , shoulders and knees. No deformity ,swelling or crepitus noted. No muscle wasting or atrophy.   Neurologic: Cranial nerves 2 to 12 intact. Power, tone ,sensation and reflexes normal throughout. No disturbance in gait. No tremor.  Skin: Intact, no ulceration, erythema , scaling or rash noted. Pigmentation normal throughout  Psych; Normal mood and affect. Judgement and concentration normal   Assessment & Plan:  Essential hypertension Uncontrolled HCTZ 12.5 mg daily added, re eval in 3 to 4 weeks DASH diet and commitment to daily physical activity for a minimum of 30 minutes discussed and encouraged, as a part of hypertension management. The importance of attaining a healthy weight is also discussed.     06/12/2022    9:44 AM 06/12/2022    9:43 AM 06/12/2022    9:03 AM 06/12/2022    8:58 AM 05/20/2022    9:56 AM 03/20/2022    9:48 AM 03/20/2022    9:37 AM  BP/Weight  Systolic BP 150 150 168 187  136 136 140  Diastolic BP 70 70 78 78 82 80 80  Wt. (Lbs)    173 169    BMI    25.55 kg/m2 24.96 kg/m2         Annual physical exam Annual exam as documented. Counseling done  re healthy lifestyle involving commitment to 150 minutes exercise per week, heart healthy diet, and attaining healthy weight.The importance of adequate sleep also discussed. Regular seat belt use and home safety, is also discussed. Changes in health habits are decided on by the patient with goals and time frames  set for achieving them. Immunization and cancer screening needs are specifically addressed at this visit.

## 2022-06-12 NOTE — Assessment & Plan Note (Signed)
Uncontrolled HCTZ 12.5 mg daily added, re eval in 3 to 4 weeks DASH diet and commitment to daily physical activity for a minimum of 30 minutes discussed and encouraged, as a part of hypertension management. The importance of attaining a healthy weight is also discussed.     06/12/2022    9:44 AM 06/12/2022    9:43 AM 06/12/2022    9:03 AM 06/12/2022    8:58 AM 05/20/2022    9:56 AM 03/20/2022    9:48 AM 03/20/2022    9:37 AM  BP/Weight  Systolic BP 150 150 168 187 136 136 140  Diastolic BP 70 70 78 78 82 80 80  Wt. (Lbs)    173 169    BMI    25.55 kg/m2 24.96 kg/m2

## 2022-06-12 NOTE — Patient Instructions (Addendum)
Welness visit need as to be scheduled please  F/u blood pressure in office in 3 to 3.5 weeks, call if you need me sooner  Blood pressure is still too high, new additional medication HCTZ 12.5 mg one daily is added  Covid vaccine  RSV vaccine  TdaP   All three vaccines are  needed and are at your pharmacy  Please get labs already ordered today, before you leave  It is important that you exercise regularly at least 30 minutes 5 times a week. If you develop chest pain, have severe difficulty breathing, or feel very tired, stop exercising immediately and seek medical attention    Thanks for choosing [facility], we consider it a privelige to serve you.

## 2022-06-13 LAB — CMP14+EGFR
ALT: 12 IU/L (ref 0–44)
AST: 19 IU/L (ref 0–40)
Albumin/Globulin Ratio: 1.5 (ref 1.2–2.2)
Albumin: 4.6 g/dL (ref 3.8–4.8)
Alkaline Phosphatase: 55 IU/L (ref 44–121)
BUN/Creatinine Ratio: 15 (ref 10–24)
BUN: 15 mg/dL (ref 8–27)
Bilirubin Total: 0.5 mg/dL (ref 0.0–1.2)
CO2: 24 mmol/L (ref 20–29)
Calcium: 9.9 mg/dL (ref 8.6–10.2)
Chloride: 100 mmol/L (ref 96–106)
Creatinine, Ser: 1.01 mg/dL (ref 0.76–1.27)
Globulin, Total: 3 g/dL (ref 1.5–4.5)
Glucose: 107 mg/dL — ABNORMAL HIGH (ref 70–99)
Potassium: 5.1 mmol/L (ref 3.5–5.2)
Sodium: 138 mmol/L (ref 134–144)
Total Protein: 7.6 g/dL (ref 6.0–8.5)
eGFR: 76 mL/min/{1.73_m2} (ref >59)

## 2022-06-13 LAB — LIPID PANEL
Chol/HDL Ratio: 3.2 ratio (ref 0.0–5.0)
Cholesterol, Total: 177 mg/dL (ref 100–199)
HDL: 55 mg/dL (ref >39)
LDL Chol Calc (NIH): 101 mg/dL — ABNORMAL HIGH (ref 0–99)
Triglycerides: 121 mg/dL (ref 0–149)
VLDL Cholesterol Cal: 21 mg/dL (ref 5–40)

## 2022-06-13 LAB — PSA: Prostate Specific Ag, Serum: 2.9 ng/mL (ref 0.0–4.0)

## 2022-06-13 LAB — CBC
Hematocrit: 37.6 % (ref 37.5–51.0)
Hemoglobin: 11.9 g/dL — ABNORMAL LOW (ref 13.0–17.7)
MCH: 29.4 pg (ref 26.6–33.0)
MCHC: 31.6 g/dL (ref 31.5–35.7)
MCV: 93 fL (ref 79–97)
Platelets: 161 10*3/uL (ref 150–450)
RBC: 4.05 x10E6/uL — ABNORMAL LOW (ref 4.14–5.80)
RDW: 11.9 % (ref 11.6–15.4)
WBC: 5.6 10*3/uL (ref 3.4–10.8)

## 2022-06-13 LAB — VITAMIN D 25 HYDROXY (VIT D DEFICIENCY, FRACTURES): Vit D, 25-Hydroxy: 58.5 ng/mL (ref 30.0–100.0)

## 2022-06-13 LAB — TSH: TSH: 1.94 u[IU]/mL (ref 0.450–4.500)

## 2022-07-10 ENCOUNTER — Telehealth: Payer: Self-pay | Admitting: Family Medicine

## 2022-07-10 ENCOUNTER — Other Ambulatory Visit: Payer: Self-pay

## 2022-07-10 MED ORDER — HYDROCHLOROTHIAZIDE 12.5 MG PO CAPS: 12.5000 mg | ORAL_CAPSULE | Freq: Every day | ORAL | 2 refills | Status: DC

## 2022-07-10 NOTE — Telephone Encounter (Signed)
Patient needs refill on   hydrochlorothiazide (MICROZIDE) 12.5 MG capsule  [facility] [address] [place] - [address], [place] [address] Phone: [phone]  Fax: [phone]

## 2022-07-24 ENCOUNTER — Ambulatory Visit (INDEPENDENT_AMBULATORY_CARE_PROVIDER_SITE_OTHER): Payer: Medicare Other | Admitting: Family Medicine

## 2022-07-24 ENCOUNTER — Encounter: Payer: Self-pay | Admitting: Family Medicine

## 2022-07-24 VITALS — BP 130/60 | HR 76 | Ht 69.0 in | Wt 168.1 lb

## 2022-07-24 DIAGNOSIS — I10 Essential (primary) hypertension: Secondary | ICD-10-CM

## 2022-07-24 DIAGNOSIS — N1831 Chronic kidney disease, stage 3a: Secondary | ICD-10-CM

## 2022-07-24 NOTE — Patient Instructions (Signed)
F/U in 5 months, call if you need me sooner  Blood pressure is good , call if you need me sooner  Keep active and continue to reduce salt in  your diet  Thanks for choosing [facility], we consider it a privelige to serve you.

## 2022-07-24 NOTE — Progress Notes (Signed)
   Bill Ward     MRN: [id number]      DOB: [birth year]   HPI [name] is here for follow up and re-evaluation of chronic medical conditions, medication management and review of any available recent lab and radiology data.  Preventive health is updated, specifically  Cancer screening and Immunization.   Questions or concerns regarding consultations or procedures which the PT has had in the interim are  addressed. The PT denies any adverse reactions to current medications since the last visit.  There are no new concerns.  There are no specific complaints   ROS Denies recent fever or chills. Denies sinus pressure, nasal congestion, ear pain or sore throat. Denies chest congestion, productive cough or wheezing. Denies chest pains, palpitations and leg swelling Denies abdominal pain, nausea, vomiting,diarrhea or constipation.   Denies dysuria, frequency, hesitancy or incontinence. Denies joint pain, swelling and limitation in mobility. Denies headaches, seizures, numbness, or tingling. Denies depression, anxiety or insomnia. Denies skin break down or rash.   PE  BP 130/60   Pulse 76   Ht 5\' 9"  (1.753 m)   Wt 168 lb 1.3 oz (76.2 kg)   SpO2 97%   BMI 24.82 kg/m   Patient alert and oriented and in no cardiopulmonary distress.  HEENT: No facial asymmetry, EOMI,     Neck supple .  Chest: Clear to auscultation bilaterally.  CVS: S1, S2 no murmurs, no S3.Regular rate.  ABD: Soft non tender.   Ext: No edema  MS: Adequate ROM spine, shoulders, hips and knees.  Skin: Intact, no ulcerations or rash noted.  Psych: Good eye contact, normal affect. Memory intact not anxious or depressed appearing.  CNS: CN 2-12 intact, power,  normal throughout.no focal deficits noted.   Assessment & Plan  Essential hypertension Controlled, no change in medication DASH diet and commitment to daily physical activity for a minimum of 30 minutes discussed and encouraged, as a part of  hypertension management. The importance of attaining a healthy weight is also discussed.     07/24/2022    9:56 AM 07/24/2022    9:33 AM 06/12/2022    9:44 AM 06/12/2022    9:43 AM 06/12/2022    9:03 AM 06/12/2022    8:58 AM 05/20/2022    9:56 AM  BP/Weight  Systolic BP 130 137 150 150 168 187 136  Diastolic BP 60 66 70 70 78 78 82  Wt. (Lbs)  168.08    173 169  BMI  24.82 kg/m2    25.55 kg/m2 24.96 kg/m2       CKD (chronic kidney disease) stage 3, GFR 30-59 ml/min (HCC) Followed by nephrology, improved with medication changes

## 2022-07-26 NOTE — Assessment & Plan Note (Addendum)
Followed by nephrology, improved with medication changes

## 2022-07-26 NOTE — Assessment & Plan Note (Signed)
Controlled, no change in medication DASH diet and commitment to daily physical activity for a minimum of 30 minutes discussed and encouraged, as a part of hypertension management. The importance of attaining a healthy weight is also discussed.     07/24/2022    9:56 AM 07/24/2022    9:33 AM 06/12/2022    9:44 AM 06/12/2022    9:43 AM 06/12/2022    9:03 AM 06/12/2022    8:58 AM 05/20/2022    9:56 AM  BP/Weight  Systolic BP 130 137 150 150 168 187 136  Diastolic BP 60 66 70 70 78 78 82  Wt. (Lbs)  168.08    173 169  BMI  24.82 kg/m2    25.55 kg/m2 24.96 kg/m2

## 2022-07-30 ENCOUNTER — Ambulatory Visit (INDEPENDENT_AMBULATORY_CARE_PROVIDER_SITE_OTHER): Payer: Medicare Other | Admitting: Family Medicine

## 2022-07-30 ENCOUNTER — Encounter: Payer: Self-pay | Admitting: Family Medicine

## 2022-07-30 NOTE — Progress Notes (Deleted)
Annual Wellness Visit     Patient: Bill Ward, Male    DOB: 1946/07/18, 76 y.o.   MRN: [id number]  Subjective  Chief Complaint  Patient presents with   Annual Exam    Bill Ward is a 76 y.o. male who presents today for his Annual Wellness Visit. He reports consuming a {diet types:17450} diet. {Exercise:19826} He generally feels {well/fairly well/poorly:18703}. He reports sleeping {well/fairly well/poorly:18703}. He {does/does not:200015} have additional problems to discuss today.   HPI  {VISON DENTAL STD PSA (Optional):27386}   {History (Optional):23778}  Medications: Outpatient Medications Prior to Visit  Medication Sig   amLODipine (NORVASC) 10 MG tablet TAKE 1 TABLET BY MOUTH  DAILY   carvedilol (COREG) 6.25 MG tablet Take 6.25 mg by mouth 2 (two) times daily.   doxazosin (CARDURA) 4 MG tablet Take 1 tablet (4 mg total) by mouth daily.   hydrochlorothiazide (MICROZIDE) 12.5 MG capsule Take 1 capsule (12.5 mg total) by mouth daily.   Vitamin D, Cholecalciferol, 25 MCG (1000 UT) CAPS Take by mouth.   No facility-administered medications prior to visit.    No Known Allergies  Patient Care Team: Kerri Perches, MD as PCP - General  ROS      Objective  There were no vitals taken for this visit. {Vitals History (Optional):23777}  Physical Exam    Most recent functional status assessment:     No data to display         Most recent fall risk assessment:    07/24/2022    9:34 AM  Fall Risk   Falls in the past year? 0  Number falls in past yr: 0  Injury with Fall? 0  Risk for fall due to : No Fall Risks  Follow up Falls evaluation completed    Most recent depression screenings:    07/24/2022    9:35 AM 06/12/2022    9:01 AM  PHQ 2/9 Scores  PHQ - 2 Score 0 0   Most recent cognitive screening:    07/25/2021   10:47 AM  6CIT Screen  What Year? 0 points  What month? 0 points  What time? 0 points  Count back from 20 0 points   Months in reverse 4 points  Repeat phrase 0 points  Total Score 4 points   Most recent Audit-C alcohol use screening    07/25/2021   10:45 AM  Alcohol Use Disorder Test (AUDIT)  1. How often do you have a drink containing alcohol? 0  2. How many drinks containing alcohol do you have on a typical day when you are drinking? 0  3. How often do you have six or more drinks on one occasion? 0  AUDIT-C Score 0   A score of 3 or more in women, and 4 or more in men indicates increased risk for alcohol abuse, EXCEPT if all of the points are from question 1   Vision/Hearing Screen: No results found.  {Labs (Optional):23779}  No results found for any visits on 07/30/22.    Assessment & Plan   Annual wellness visit done today including the all of the following: Reviewed patient's Family Medical History Reviewed and updated list of patient's medical providers Assessment of cognitive impairment was done Assessed patient's functional ability Established a written schedule for health screening services Health Risk Assessent Completed and Reviewed  Exercise Activities and Dietary recommendations  Goals      DIET - INCREASE WATER INTAKE  Immunization History  Administered Date(s) Administered   Fluad Quad(high Dose 65+) 03/24/2019, 04/18/2021, 05/06/2022   Influenza Split 07/09/2011, 05/07/2012   Influenza Whole 05/14/2005, 06/19/2009, 06/26/2010   Influenza,inj,Quad PF,6+ Mos 05/12/2013, 05/23/2014, 05/01/2015, 05/13/2016, 04/16/2017, 05/27/2018, 03/27/2020   Moderna SARS-COV2 Booster Vaccination 04/23/2021   Moderna Sars-Covid-2 Vaccination 10/09/2019, 11/06/2019, 07/05/2022   Pneumococcal Conjugate-13 03/22/2014   Pneumococcal Polysaccharide-23 02/07/2009   Td 11/08/2004   Zoster Recombinat (Shingrix) 06/08/2021, 09/25/2021    Health Maintenance  Topic Date Due   DTaP/Tdap/Td (2 - Tdap) 11/09/2014   COVID-19 Vaccine (5 - 2023-24 season) 08/30/2022   Medicare Annual  Wellness (AWV)  07/31/2023   Pneumonia Vaccine 69+ Years old  Completed   INFLUENZA VACCINE  Completed   Hepatitis C Screening  Completed   Zoster Vaccines- Shingrix  Completed   HPV VACCINES  Aged Out   COLONOSCOPY (Pts 45-61yrs Insurance coverage will need to be confirmed)  Discontinued     Discussed health benefits of physical activity, and encouraged him to engage in regular exercise appropriate for his age and condition.    Problem List Items Addressed This Visit   None Visit Diagnoses     Encounter for Medicare annual wellness exam    -  Primary       Return in 1 year (on 07/31/2023).     Cruzita Lederer Newman Nip, FNP

## 2022-07-31 ENCOUNTER — Encounter: Payer: Self-pay | Admitting: Family Medicine

## 2022-07-31 ENCOUNTER — Ambulatory Visit (INDEPENDENT_AMBULATORY_CARE_PROVIDER_SITE_OTHER): Payer: Medicare Other | Admitting: Family Medicine

## 2022-07-31 VITALS — Ht 69.0 in | Wt 168.0 lb

## 2022-07-31 DIAGNOSIS — Z0000 Encounter for general adult medical examination without abnormal findings: Secondary | ICD-10-CM | POA: Diagnosis not present

## 2022-07-31 NOTE — Progress Notes (Addendum)
Annual Wellness Visit     Patient: Bill Ward, Male    DOB: 1945/10/21, 76 y.o.   MRN: [id number]  Subjective  Chief Complaint  Patient presents with   Annual Exam    SHAUL TRAUTMAN is a 76 y.o. male who presents today for his Annual Wellness Visit. He reports consuming a general  and low sodium diet.  Patient stated he has daily walk for about 30 minutes to an hour  He generally feels well. He reports sleeping well. He does not have additional problems to discuss today.    Patient Location: At home. Provider Location: [facility] Total time spent with patient  via telehealth/audio 15 minutes    HPI  Vision:Within last year   Patient Active Problem List   Diagnosis Date Noted   CKD (chronic kidney disease) stage 3, GFR 30-59 ml/min ([place]) 06/07/2021   Reduced vision 03/27/2020   Annual physical exam 04/18/2017   Essential hypertension 11/18/2007      Medications: Outpatient Medications Prior to Visit  Medication Sig   amLODipine (NORVASC) 10 MG tablet TAKE 1 TABLET BY MOUTH  DAILY   carvedilol (COREG) 6.25 MG tablet Take 6.25 mg by mouth 2 (two) times daily.   doxazosin (CARDURA) 4 MG tablet Take 1 tablet (4 mg total) by mouth daily.   hydrochlorothiazide (MICROZIDE) 12.5 MG capsule Take 1 capsule (12.5 mg total) by mouth daily.   Vitamin D, Cholecalciferol, 25 MCG (1000 UT) CAPS Take by mouth.   No facility-administered medications prior to visit.    No Known Allergies  Patient Care Team: Fayrene Helper, MD as PCP - General  ROS      Objective  Ht _0  (1.753 m)   Wt 168 lb (76.2 kg)   BMI 24.81 kg/m  BP Readings from Last 3 Encounters:  07/24/22 130/60  06/12/22 (!) 150/70  05/20/22 136/82      Physical Exam    Most recent functional status assessment:    07/31/2022    3:29 PM  In your present state of health, do you have any difficulty performing the following activities:  Hearing? 0  Vision? 0   Difficulty concentrating or making decisions? 0  Walking or climbing stairs? 0  Dressing or bathing? 0  Doing errands, shopping? 0   Most recent fall risk assessment:    07/31/2022    3:30 PM  [place] in the past year? 0  Number falls in past yr: 0  Injury with Fall? 0  Risk for fall due to : No Fall Risks  Follow up Falls evaluation completed    Most recent depression screenings:    07/31/2022    3:30 PM 07/24/2022    9:35 AM  PHQ 2/9 Scores  PHQ - 2 Score 0 0  PHQ- 9 Score 0    Most recent cognitive screening:    07/25/2021   10:47 AM  6CIT Screen  What Year? 0 points  What month? 0 points  What time? 0 points  Count back from 20 0 points  Months in reverse 4 points  Repeat phrase 0 points  Total Score 4 points   Most recent Audit-C alcohol use screening    07/31/2022    3:31 PM  Alcohol Use Disorder Test (AUDIT)  1. How often do you have a drink containing alcohol? 0  2. How many drinks containing alcohol do you have on a typical day when you are drinking? 0  3. How often do you have six or more drinks on one occasion? 0  AUDIT-C Score 0   A score of 3 or more in women, and 4 or more in men indicates increased risk for alcohol abuse, EXCEPT if all of the points are from question 1   Vision/Hearing Screen: No results found.  Last CBC Lab Results  Component Value Date   WBC 5.6 06/12/2022   HGB 11.9 (L) 06/12/2022   HCT 37.6 06/12/2022   MCV 93 06/12/2022   MCH 29.4 06/12/2022   RDW 11.9 06/12/2022   PLT 161 37/34/2876   Last metabolic panel Lab Results  Component Value Date   GLUCOSE 107 (H) 06/12/2022   NA 138 06/12/2022   K 5.1 06/12/2022   CL 100 06/12/2022   CO2 24 06/12/2022   BUN 15 06/12/2022   CREATININE 1.01 06/12/2022   EGFR 76 06/12/2022   CALCIUM 9.9 06/12/2022   PROT 7.6 06/12/2022   ALBUMIN 4.6 06/12/2022   LABGLOB 3.0 06/12/2022   AGRATIO 1.5 06/12/2022   BILITOT 0.5 06/12/2022   ALKPHOS 55 06/12/2022   AST  19 06/12/2022   ALT 12 06/12/2022   Last lipids Lab Results  Component Value Date   CHOL 177 06/12/2022   HDL 55 06/12/2022   LDLCALC 101 (H) 06/12/2022   TRIG 121 06/12/2022   CHOLHDL 3.2 06/12/2022   Last hemoglobin A1c Lab Results  Component Value Date   HGBA1C 5.0 10/16/2020   Last thyroid functions Lab Results  Component Value Date   TSH 1.940 06/12/2022   Last vitamin D Lab Results  Component Value Date   VD25OH 58.5 06/12/2022   Last vitamin B12 and Folate No results found for: "VITAMINB12", "FOLATE"    No results found for any visits on 07/31/22.    Assessment & Plan   Annual wellness visit done today including the all of the following: Reviewed patient's Family Medical History Reviewed and updated list of patient's medical providers Assessment of cognitive impairment was done Assessed patient's functional ability Established a written schedule for health screening [place] Completed and Reviewed  Exercise Activities and Dietary recommendations  Goals      DIET - INCREASE WATER INTAKE        Immunization History  Administered Date(s) Administered   Fluad Quad(high Dose 65+) 03/24/2019, 04/18/2021, 05/06/2022   Influenza Split 07/09/2011, 05/07/2012   Influenza Whole 05/14/2005, 06/19/2009, 06/26/2010   Influenza,inj,Quad PF,6+ Mos 05/12/2013, 05/23/2014, 05/01/2015, 05/13/2016, 04/16/2017, 05/27/2018, 03/27/2020   Moderna SARS-COV2 Booster Vaccination 04/23/2021   Moderna Sars-Covid-2 Vaccination 10/09/2019, 11/06/2019, 07/05/2022   Pneumococcal Conjugate-13 03/22/2014   Pneumococcal Polysaccharide-23 02/07/2009   Td 11/08/2004   Zoster Recombinat (Shingrix) 06/08/2021, 09/25/2021    Health Maintenance  Topic Date Due   DTaP/Tdap/Td (2 - Tdap) 11/09/2014   COVID-19 Vaccine (5 - 2023-24 season) 08/30/2022   Medicare Annual Wellness (AWV)  08/01/2023   Pneumonia Vaccine 19+ Years old  Completed   INFLUENZA VACCINE   Completed   Hepatitis C Screening  Completed   Zoster Vaccines- Shingrix  Completed   HPV VACCINES  Aged Out   COLONOSCOPY (Pts 45-1yr Insurance coverage will need to be confirmed)  Discontinued     Discussed health benefits of physical activity, and encouraged him to engage in regular exercise appropriate for his age and condition.    Problem List Items Addressed This Visit   None Visit Diagnoses     Encounter for Medicare annual wellness exam    -  Primary       Return in 1 year (on 08/01/2023).     Renard Hamper Ria Comment, FNP

## 2022-08-15 ENCOUNTER — Telehealth: Payer: Self-pay | Admitting: Family Medicine

## 2022-08-15 ENCOUNTER — Other Ambulatory Visit: Payer: Self-pay

## 2022-08-15 MED ORDER — HYDROCHLOROTHIAZIDE 12.5 MG PO CAPS: 12.5000 mg | ORAL_CAPSULE | Freq: Every day | ORAL | 2 refills | Status: DC

## 2022-08-15 NOTE — Telephone Encounter (Signed)
Prescription Request  08/15/2022  Is this a "Controlled Substance" medicine? No  LOV: 07/24/2022  What is the name of the medication or equipment? hydrochlorothiazide (MICROZIDE) 12.5 MG capsule   Have you contacted your pharmacy to request a refill? No   Which pharmacy would you like this sent to?  [place], [place] - [address] [place] [address] Phone: [phone] Fax: [phone]  [facility] - [place], [place] [facility] [address] [place] [address] [place] [address] Phone: [phone] Fax: [place], [place] [address] Phone: [phone] Fax: [phone]    Patient notified that their request is being sent to the clinical staff for review and that they should receive a response within 2 business days.   Please advise at [facility] [phone]

## 2022-08-15 NOTE — Telephone Encounter (Signed)
Refills sent

## 2022-08-21 DIAGNOSIS — D638 Anemia in other chronic diseases classified elsewhere: Secondary | ICD-10-CM | POA: Diagnosis not present

## 2022-08-21 DIAGNOSIS — N1831 Chronic kidney disease, stage 3a: Secondary | ICD-10-CM | POA: Diagnosis not present

## 2022-08-21 DIAGNOSIS — E559 Vitamin D deficiency, unspecified: Secondary | ICD-10-CM | POA: Diagnosis not present

## 2022-08-21 DIAGNOSIS — I129 Hypertensive chronic kidney disease with stage 1 through stage 4 chronic kidney disease, or unspecified chronic kidney disease: Secondary | ICD-10-CM | POA: Diagnosis not present

## 2022-09-10 DIAGNOSIS — N1831 Chronic kidney disease, stage 3a: Secondary | ICD-10-CM | POA: Diagnosis not present

## 2022-09-10 DIAGNOSIS — I129 Hypertensive chronic kidney disease with stage 1 through stage 4 chronic kidney disease, or unspecified chronic kidney disease: Secondary | ICD-10-CM | POA: Diagnosis not present

## 2022-09-10 DIAGNOSIS — E871 Hypo-osmolality and hyponatremia: Secondary | ICD-10-CM | POA: Diagnosis not present

## 2022-11-05 ENCOUNTER — Telehealth: Payer: Self-pay | Admitting: Family Medicine

## 2022-11-05 NOTE — Telephone Encounter (Signed)
Prescription Request  11/05/2022  LOV: 07/24/2022  What is the name of the medication or equipment? hydrochlorothiazide (MICROZIDE) 12.5 MG capsule [id number]    Have you contacted your pharmacy to request a refill? Yes   Which pharmacy would you like this sent to?   [place], [place] - [place] [id number] [place] [address] [place] [address] Phone: [phone]  Fax: [phone]   Patient notified that their request is being sent to the clinical staff for review and that they should receive a response within 2 business days.   Please advise at [facility] [phone]

## 2022-11-06 ENCOUNTER — Other Ambulatory Visit: Payer: Self-pay

## 2022-11-06 MED ORDER — HYDROCHLOROTHIAZIDE 12.5 MG PO CAPS: 12.5000 mg | ORAL_CAPSULE | Freq: Every day | ORAL | 2 refills | Status: DC

## 2022-11-06 NOTE — Telephone Encounter (Signed)
Refills sent

## 2022-12-06 DIAGNOSIS — E559 Vitamin D deficiency, unspecified: Secondary | ICD-10-CM | POA: Diagnosis not present

## 2022-12-06 DIAGNOSIS — I129 Hypertensive chronic kidney disease with stage 1 through stage 4 chronic kidney disease, or unspecified chronic kidney disease: Secondary | ICD-10-CM | POA: Diagnosis not present

## 2022-12-06 DIAGNOSIS — N1831 Chronic kidney disease, stage 3a: Secondary | ICD-10-CM | POA: Diagnosis not present

## 2022-12-06 DIAGNOSIS — E871 Hypo-osmolality and hyponatremia: Secondary | ICD-10-CM | POA: Diagnosis not present

## 2022-12-18 DIAGNOSIS — N1831 Chronic kidney disease, stage 3a: Secondary | ICD-10-CM | POA: Diagnosis not present

## 2022-12-18 DIAGNOSIS — D638 Anemia in other chronic diseases classified elsewhere: Secondary | ICD-10-CM | POA: Diagnosis not present

## 2022-12-18 DIAGNOSIS — I129 Hypertensive chronic kidney disease with stage 1 through stage 4 chronic kidney disease, or unspecified chronic kidney disease: Secondary | ICD-10-CM | POA: Diagnosis not present

## 2022-12-24 ENCOUNTER — Ambulatory Visit (INDEPENDENT_AMBULATORY_CARE_PROVIDER_SITE_OTHER): Payer: Medicare Other | Admitting: Family Medicine

## 2022-12-24 ENCOUNTER — Encounter: Payer: Self-pay | Admitting: Family Medicine

## 2022-12-24 VITALS — BP 138/70 | HR 100 | Ht 69.0 in | Wt 175.0 lb

## 2022-12-24 DIAGNOSIS — Z23 Encounter for immunization: Secondary | ICD-10-CM | POA: Diagnosis not present

## 2022-12-24 DIAGNOSIS — I10 Essential (primary) hypertension: Secondary | ICD-10-CM

## 2022-12-24 DIAGNOSIS — N1831 Chronic kidney disease, stage 3a: Secondary | ICD-10-CM

## 2022-12-24 MED ORDER — AMLODIPINE BESYLATE 5 MG PO TABS: 5.0000 mg | ORAL_TABLET | Freq: Every day | ORAL | 2 refills | Status: DC

## 2022-12-24 MED ORDER — UNABLE TO FIND: 0 refills | Status: AC

## 2022-12-24 NOTE — Assessment & Plan Note (Signed)
Followed closely by Neopgrology

## 2022-12-24 NOTE — Progress Notes (Signed)
   Bill Ward     MRN: [id number]      DOB: 05-14-1946  Chief Complaint  Patient presents with   Follow-up    Follow up    HPI Bill Ward is here for follow up and re-evaluation of chronic medical conditions, in particular uncontrolled hypertension Has been taking amlodipine 10 mg medication on avg 3 days/ week Has all medication bottles with him.  Preventive health is updated, specifically  Cancer screening and Immunization.   Questions or concerns regarding consultations or procedures which the PT has had in the interim are  addressed. The PT denies any adverse reactions to current medications since the last visit.  ROS Denies recent fever or chills. Denies sinus pressure, nasal congestion, ear pain or sore throat. Denies chest congestion, productive cough or wheezing. Denies chest pains, palpitations and leg swelling Denies abdominal pain, nausea, vomiting,diarrhea or constipation.   Denies dysuria, frequency, hesitancy or incontinence. Denies joint pain, swelling and limitation in mobility. Denies headaches, seizures, numbness, or tingling. Denies depression, anxiety or insomnia. Denies skin break down or rash.   PE  BP 138/70   Pulse 100   Ht 5\' 9"  (1.753 m)   Wt 175 lb (79.4 kg)   SpO2 96%   BMI 25.84 kg/m   Patient alert and oriented and in no cardiopulmonary distress.  HEENT: No facial asymmetry, EOMI,     Neck supple .  Chest: Clear to auscultation bilaterally.  CVS: S1, S2 no murmurs, no S3.Regular rate.  ABD: Soft non tender.   Ext: No edema  MS: Adequate ROM spine, shoulders, hips and knees.  Skin: Intact, no ulcerations or rash noted.  Psych: Good eye contact, normal affect. Memory intact not anxious or depressed appearing.  CNS: CN 2-12 intact, power,  normal throughout.no focal deficits noted.   Assessment & Plan  Essential hypertension Inadequately managed as pt takes amlodipine alternate days, today bP re checked is good and had it  last 2 days ago Will reduce dose to 5 mg daily, he is to continuen other meds , and return in 6 weeks for re eval DASH diet and commitment to daily physical activity for a minimum of 30 minutes discussed and encouraged, as a part of hypertension management. The importance of attaining a healthy weight is also discussed.     12/24/2022   11:53 AM 12/24/2022   11:16 AM 12/24/2022   11:14 AM 07/31/2022    3:29 PM 07/24/2022    9:56 AM 07/24/2022    9:33 AM 06/12/2022    9:44 AM  BP/Weight  Systolic BP 138 148 150  130 137 150  Diastolic BP 70 68 65  60 66 70  Wt. (Lbs)   175 168  168.08   BMI   25.84 kg/m2 24.81 kg/m2  24.82 kg/m2        CKD (chronic kidney disease) stage 3, GFR 30-59 ml/min (HCC) Followed closely by Neopgrology

## 2022-12-24 NOTE — Patient Instructions (Signed)
F/U in 5 to 6 weeks re evaluate blood pressure, EKG at visit  New lower dose of amlodipine 5 mg daily, stop amlodipine 10 mg, sent to [facility]  Non fasting chem 7 and EGFR on same day of next appointment  TdAP past duee please get at the pharmacy, Nurse pls pprint rx for pt  Thanks for choosing [facility], we consider it a privelige to serve you.

## 2022-12-24 NOTE — Assessment & Plan Note (Signed)
Inadequately managed as pt takes amlodipine alternate days, today bP re checked is good and had it last 2 days ago Will reduce dose to 5 mg daily, he is to continuen other meds , and return in 6 weeks for re eval DASH diet and commitment to daily physical activity for a minimum of 30 minutes discussed and encouraged, as a part of hypertension management. The importance of attaining a healthy weight is also discussed.     12/24/2022   11:53 AM 12/24/2022   11:16 AM 12/24/2022   11:14 AM 07/31/2022    3:29 PM 07/24/2022    9:56 AM 07/24/2022    9:33 AM 06/12/2022    9:44 AM  BP/Weight  Systolic BP 138 148 150  130 137 150  Diastolic BP 70 68 65  60 66 70  Wt. (Lbs)   175 168  168.08   BMI   25.84 kg/m2 24.81 kg/m2  24.82 kg/m2

## 2023-02-14 ENCOUNTER — Ambulatory Visit (INDEPENDENT_AMBULATORY_CARE_PROVIDER_SITE_OTHER): Payer: Medicare Other | Admitting: Family Medicine

## 2023-02-14 ENCOUNTER — Encounter: Payer: Self-pay | Admitting: Family Medicine

## 2023-02-14 VITALS — BP 128/72 | HR 81 | Ht 69.0 in | Wt 171.1 lb

## 2023-02-14 DIAGNOSIS — I10 Essential (primary) hypertension: Secondary | ICD-10-CM

## 2023-02-14 DIAGNOSIS — Z125 Encounter for screening for malignant neoplasm of prostate: Secondary | ICD-10-CM

## 2023-02-14 DIAGNOSIS — Z13220 Encounter for screening for lipoid disorders: Secondary | ICD-10-CM

## 2023-02-14 DIAGNOSIS — E663 Overweight: Secondary | ICD-10-CM

## 2023-02-14 MED ORDER — AMLODIPINE BESYLATE 5 MG PO TABS: 5.0000 mg | ORAL_TABLET | Freq: Every day | ORAL | 3 refills | Status: DC

## 2023-02-14 NOTE — Patient Instructions (Addendum)
Annual exam 11/12 or after, call if you need me sooner  Fasting lipid, cmp and PSA and TSH 11/8  or after  BLood pressure is excellent , no change in meds  It is important that you exercise regularly at least 30 minutes 5 times a week. If you develop chest pain, have severe difficulty breathing, or feel very tired, stop exercising immediately and seek medical attention    Thanks for choosing [facility], we consider it a privelige to serve you.

## 2023-02-14 NOTE — Progress Notes (Signed)
   CANDLER DENGER     MRN: [id number]      DOB: 1945/09/15  Chief Complaint  Patient presents with   Follow-up    Follow up leg swelling     HPI Bill Ward is here for follow up and re-evaluation of chronic medical conditions, medication management and review of any available recent lab and radiology data.  Preventive health is updated, specifically  Cancer screening and Immunization.   Questions or concerns regarding consultations or procedures which the PT has had in the interim are  addressed.   C/o ankle swelling when he wears elasticized socks , and swelling goes down overnight  ROS Denies recent fever or chills. Denies sinus pressure, nasal congestion, ear pain or sore throat. Denies chest congestion, productive cough or wheezing. Denies chest pains, palpitations , pND or orthopneaDenies abdominal pain, nausea, vomiting,diarrhea or constipation.   Denies dysuria, frequency, hesitancy or incontinence. Denies joint pain, swelling and limitation in mobility. Denies headaches, seizures, numbness, or tingling. Denies depression, anxiety or insomnia. Denies skin break down or rash.   PE  BP 128/72   Pulse 81   Ht 5\' 9"  (1.753 m)   Wt 171 lb 1.9 oz (77.6 kg)   SpO2 95%   BMI 25.27 kg/m   Patient alert and oriented and in no cardiopulmonary distress.  HEENT: No facial asymmetry, EOMI,     Neck supple .  Chest: Clear to auscultation bilaterally.  CVS: S1, S2 no murmurs, no S3.Regular rate.  ABD: Soft non tender.   Ext: No edema  MS: Adequate ROM spine, shoulders, hips and knees.  Skin: Intact, no ulcerations or rash noted.  Psych: Good eye contact, normal affect. Memory intact not anxious or depressed appearing.  CNS: CN 2-12 intact, power,  normal throughout.no focal deficits noted.   Assessment & Plan  Overweight (BMI 25.0-29.9)  Patient re-educated about  the importance of commitment to a  minimum of 150 minutes of exercise per week as able.  The  importance of healthy food choices with portion control discussed, as well as eating regularly and within a 12 hour window most days. The need to choose "clean , green" food 50 to 75% of the time is discussed, as well as to make water the primary drink and set a goal of 64 ounces water daily.       02/14/2023   11:31 AM 12/24/2022   11:14 AM 07/31/2022    3:29 PM  Weight /BMI  Weight 171 lb 1.9 oz 175 lb 168 lb  Height 5\' 9"  (1.753 m) 5\' 9"  (1.753 m) 5\' 9"  (1.753 m)  BMI 25.27 kg/m2 25.84 kg/m2 24.81 kg/m2    Improved

## 2023-02-14 NOTE — Assessment & Plan Note (Signed)
  Patient re-educated about  the importance of commitment to a  minimum of 150 minutes of exercise per week as able.  The importance of healthy food choices with portion control discussed, as well as eating regularly and within a 12 hour window most days. The need to choose "clean , green" food 50 to 75% of the time is discussed, as well as to make water the primary drink and set a goal of 64 ounces water daily.       02/14/2023   11:31 AM 12/24/2022   11:14 AM 07/31/2022    3:29 PM  Weight /BMI  Weight 171 lb 1.9 oz 175 lb 168 lb  Height 5\' 9"  (1.753 m) 5\' 9"  (1.753 m) 5\' 9"  (1.753 m)  BMI 25.27 kg/m2 25.84 kg/m2 24.81 kg/m2    Improved

## 2023-04-14 DIAGNOSIS — N1831 Chronic kidney disease, stage 3a: Secondary | ICD-10-CM | POA: Diagnosis not present

## 2023-04-14 DIAGNOSIS — E611 Iron deficiency: Secondary | ICD-10-CM | POA: Diagnosis not present

## 2023-04-14 DIAGNOSIS — D638 Anemia in other chronic diseases classified elsewhere: Secondary | ICD-10-CM | POA: Diagnosis not present

## 2023-04-14 DIAGNOSIS — I129 Hypertensive chronic kidney disease with stage 1 through stage 4 chronic kidney disease, or unspecified chronic kidney disease: Secondary | ICD-10-CM | POA: Diagnosis not present

## 2023-04-21 DIAGNOSIS — D638 Anemia in other chronic diseases classified elsewhere: Secondary | ICD-10-CM | POA: Diagnosis not present

## 2023-04-21 DIAGNOSIS — E559 Vitamin D deficiency, unspecified: Secondary | ICD-10-CM | POA: Diagnosis not present

## 2023-04-21 DIAGNOSIS — N1831 Chronic kidney disease, stage 3a: Secondary | ICD-10-CM | POA: Diagnosis not present

## 2023-06-10 DIAGNOSIS — I10 Essential (primary) hypertension: Secondary | ICD-10-CM | POA: Diagnosis not present

## 2023-06-10 DIAGNOSIS — Z13220 Encounter for screening for lipoid disorders: Secondary | ICD-10-CM | POA: Diagnosis not present

## 2023-06-11 LAB — LIPID PANEL
Chol/HDL Ratio: 3.1 {ratio} (ref 0.0–5.0)
Cholesterol, Total: 154 mg/dL (ref 100–199)
HDL: 50 mg/dL (ref >39)
LDL Chol Calc (NIH): 89 mg/dL (ref 0–99)
Triglycerides: 77 mg/dL (ref 0–149)
VLDL Cholesterol Cal: 15 mg/dL (ref 5–40)

## 2023-06-11 LAB — CMP14+EGFR
ALT: 12 [IU]/L (ref 0–44)
AST: 19 [IU]/L (ref 0–40)
Albumin: 4.2 g/dL (ref 3.8–4.8)
Alkaline Phosphatase: 57 [IU]/L (ref 44–121)
BUN/Creatinine Ratio: 18 (ref 10–24)
BUN: 22 mg/dL (ref 8–27)
Bilirubin Total: 0.4 mg/dL (ref 0.0–1.2)
CO2: 23 mmol/L (ref 20–29)
Calcium: 9.5 mg/dL (ref 8.6–10.2)
Chloride: 103 mmol/L (ref 96–106)
Creatinine, Ser: 1.23 mg/dL (ref 0.76–1.27)
Globulin, Total: 2.8 g/dL (ref 1.5–4.5)
Glucose: 102 mg/dL — ABNORMAL HIGH (ref 70–99)
Potassium: 4.7 mmol/L (ref 3.5–5.2)
Sodium: 136 mmol/L (ref 134–144)
Total Protein: 7 g/dL (ref 6.0–8.5)
eGFR: 60 mL/min/{1.73_m2} (ref >59)

## 2023-06-11 LAB — PSA: Prostate Specific Ag, Serum: 1.9 ng/mL (ref 0.0–4.0)

## 2023-06-11 LAB — TSH: TSH: 1.02 u[IU]/mL (ref 0.450–4.500)

## 2023-06-16 ENCOUNTER — Other Ambulatory Visit: Payer: Self-pay

## 2023-06-16 ENCOUNTER — Telehealth: Payer: Self-pay | Admitting: Family Medicine

## 2023-06-16 MED ORDER — CARVEDILOL 12.5 MG PO TABS: 12.5000 mg | ORAL_TABLET | Freq: Two times a day (BID) | ORAL | 3 refills | Status: DC

## 2023-06-16 NOTE — Telephone Encounter (Signed)
Medication sent.

## 2023-06-16 NOTE — Telephone Encounter (Signed)
Copied from CRM [id number]. Topic: Clinical - Medication Refill >> Jun 16, 2023 10:11 AM Herbert Seta B wrote: Most Recent Primary Care Visit:  Provider: Kerri Perches  Department: [facility]  Visit Type: OFFICE VISIT  Date: 02/14/2023  Medication:  carvedilol (COREG) 12.5 MG tablet   Has the patient contacted their pharmacy? No (Agent: If no, request that the patient contact the pharmacy for the refill. If patient does not wish to contact the pharmacy document the reason why and proceed with request.) (Agent: If yes, when and what did the pharmacy advise?)  Is this the correct pharmacy for this prescription? Yes If no, delete pharmacy and type the correct one.  This is the patient's preferred pharmacy:   [facility]) - [place], [place] - [address] [facility] [address] [place] [address] [place] [address] Phone: [phone] Fax: [phone]  [facility] - [place], [place] - [address] [place] [address] Phone: [phone] Fax: [phone]   Has the prescription been filled recently? Yes  Is the patient out of the medication? No(4 days)  Has the patient been seen for an appointment in the last year OR does the patient have an upcoming appointment? Yes  Can we respond through MyChart? Yes  Agent: Please be advised that Rx refills may take up to 3 business days. We ask that you follow-up with your pharmacy.

## 2023-06-18 ENCOUNTER — Ambulatory Visit (INDEPENDENT_AMBULATORY_CARE_PROVIDER_SITE_OTHER): Payer: Medicare Other | Admitting: Family Medicine

## 2023-06-18 ENCOUNTER — Encounter: Payer: Self-pay | Admitting: Family Medicine

## 2023-06-18 VITALS — BP 151/68 | HR 68 | Ht 69.0 in | Wt 170.1 lb

## 2023-06-18 DIAGNOSIS — Z0001 Encounter for general adult medical examination with abnormal findings: Secondary | ICD-10-CM

## 2023-06-18 DIAGNOSIS — I10 Essential (primary) hypertension: Secondary | ICD-10-CM | POA: Diagnosis not present

## 2023-06-18 DIAGNOSIS — N183 Chronic kidney disease, stage 3 unspecified: Secondary | ICD-10-CM | POA: Diagnosis not present

## 2023-06-18 DIAGNOSIS — Z23 Encounter for immunization: Secondary | ICD-10-CM | POA: Diagnosis not present

## 2023-06-18 MED ORDER — CARVEDILOL 12.5 MG PO TABS: 12.5000 mg | ORAL_TABLET | Freq: Two times a day (BID) | ORAL | 1 refills | Status: DC

## 2023-06-18 MED ORDER — CARVEDILOL 12.5 MG PO TABS: 12.5000 mg | ORAL_TABLET | Freq: Two times a day (BID) | ORAL | 3 refills | Status: DC

## 2023-06-18 NOTE — Patient Instructions (Addendum)
F/U in 5 months, call iof you need me sooner  Take medications as listed and we discussed, blood pressure is high  Flu vaccine today  Labs are good  CBC, chem 7 and eGFR 3 to 5 days before follow up, non fasting  Thanks for choosing [facility], we consider it a privelige to serve you.

## 2023-06-22 DIAGNOSIS — Z23 Encounter for immunization: Secondary | ICD-10-CM | POA: Insufficient documentation

## 2023-06-22 DIAGNOSIS — Z0001 Encounter for general adult medical examination with abnormal findings: Secondary | ICD-10-CM | POA: Insufficient documentation

## 2023-06-22 NOTE — Assessment & Plan Note (Signed)
Annual exam as documented. . Immunization and cancer screening needs are specifically addressed at this visit.  

## 2023-06-22 NOTE — Progress Notes (Signed)
   Bill Ward     MRN: [id number]      DOB: 02-03-1946  Chief Complaint  Patient presents with   Annual Exam    CPE flu shot    HPI: Patient is in for annual physical exam. No other health concerns are expressed or addressed at the visit. Recent labs,  are reviewed. Immunization is reviewed , and  updated if needed.    PE; BP (!) 151/68 (BP Location: Left Arm, Patient Position: Sitting, Cuff Size: Large)   Pulse 68   Ht 5\' 9"  (1.753 m)   Wt 170 lb 1.3 oz (77.1 kg)   SpO2 97%   BMI 25.12 kg/m   Pleasant male, alert and oriented x 3, in no cardio-pulmonary distress. Afebrile. HEENT No facial trauma or asymetry. Sinuses non tender. EOMI External ears normal,  Neck: supple, no adenopathy,JVD or thyromegaly.No bruits.  Chest: Clear to ascultation bilaterally.No crackles or wheezes. Non tender to palpation  Cardiovascular system; Heart sounds normal,  S1 and  S2 ,no S3.  No murmur, or thrill. Apical beat not displaced Peripheral pulses normal.  Abdomen: Soft, non tender, no organomegaly or masses. No bruits. Bowel sounds normal. No guarding, tenderness or rebound.    Musculoskeletal exam: Full ROM of spine, hips , shoulders and knees. No deformity ,swelling or crepitus noted. No muscle wasting or atrophy.   Neurologic: Cranial nerves 2 to 12 intact. Power, tone ,sensation and reflexes normal throughout. No disturbance in gait. No tremor.  Skin: Intact, no ulceration, erythema , scaling or rash noted. Pigmentation normal throughout  Psych; Normal mood and affect. Judgement and concentration normal   Assessment & Plan:  Encounter for Medicare annual examination with abnormal findings Annual exam as documented.  Immunization and cancer screening needs are specifically addressed at this visit.   Encounter for immunization After obtaining informed consent, the influenza vaccine is  administered , with no adverse effect noted at the time of  administration.

## 2023-06-22 NOTE — Assessment & Plan Note (Signed)
After obtaining informed consent, the influenza vaccine is  administered , with no adverse effect noted at the time of administration.

## 2023-07-18 DIAGNOSIS — N189 Chronic kidney disease, unspecified: Secondary | ICD-10-CM | POA: Diagnosis not present

## 2023-07-18 DIAGNOSIS — D638 Anemia in other chronic diseases classified elsewhere: Secondary | ICD-10-CM | POA: Diagnosis not present

## 2023-07-18 DIAGNOSIS — N1831 Chronic kidney disease, stage 3a: Secondary | ICD-10-CM | POA: Diagnosis not present

## 2023-07-18 DIAGNOSIS — I129 Hypertensive chronic kidney disease with stage 1 through stage 4 chronic kidney disease, or unspecified chronic kidney disease: Secondary | ICD-10-CM | POA: Diagnosis not present

## 2023-07-22 DIAGNOSIS — N1831 Chronic kidney disease, stage 3a: Secondary | ICD-10-CM | POA: Diagnosis not present

## 2023-07-22 DIAGNOSIS — D638 Anemia in other chronic diseases classified elsewhere: Secondary | ICD-10-CM | POA: Diagnosis not present

## 2023-07-22 DIAGNOSIS — I129 Hypertensive chronic kidney disease with stage 1 through stage 4 chronic kidney disease, or unspecified chronic kidney disease: Secondary | ICD-10-CM | POA: Diagnosis not present

## 2023-08-04 ENCOUNTER — Ambulatory Visit (INDEPENDENT_AMBULATORY_CARE_PROVIDER_SITE_OTHER): Payer: Medicare Other

## 2023-08-04 VITALS — Ht 69.0 in | Wt 169.0 lb

## 2023-08-04 DIAGNOSIS — Z0000 Encounter for general adult medical examination without abnormal findings: Secondary | ICD-10-CM

## 2023-08-04 NOTE — Progress Notes (Signed)
 Because this visit was a virtual/telehealth visit,  certain criteria was not obtained, such a blood pressure, CBG if applicable, and timed get up and go. Any medications not marked as "taking" were not mentioned during the medication reconciliation part of the visit. Any vitals not documented were not able to be obtained due to this being a telehealth visit or patient was unable to self-report a recent blood pressure reading due to a lack of equipment at home via telehealth. Vitals that have been documented are verbally provided by the patient.   Subjective:   [name] is a 77 y.o. male who presents for Medicare Annual/Subsequent preventive examination.  Visit Complete: Virtual I connected with  Bill Ward on 08/04/23 by a audio enabled telemedicine application and verified that I am speaking with the correct person using two identifiers.  Patient Location: Home  Provider Location: Home Office  I discussed the limitations of evaluation and management by telemedicine. The patient expressed understanding and agreed to proceed.  Vital Signs: Because this visit was a virtual/telehealth visit, some criteria may be missing or patient reported. Any vitals not documented were not able to be obtained and vitals that have been documented are patient reported.  Patient Medicare AWV questionnaire was completed by the patient on na; I have confirmed that all information answered by patient is correct and no changes since this date.  Cardiac Risk Factors include: advanced age (>31men, >80 women);hypertension;male gender     Objective:    Today's Vitals   08/04/23 1313 08/04/23 1315  Weight: 169 lb (76.7 kg)   Height: 5\' 9"  (1.753 m)   PainSc:  0-No pain   Body mass index is 24.96 kg/m.     08/04/2023    1:16 PM 07/25/2021   10:46 AM 07/20/2020   11:04 AM 07/07/2018    1:12 PM 06/30/2017   11:57 AM 01/02/2017    8:25 AM  Advanced Directives  Does Patient Have a Medical Advance  Directive? No No No No No No  Would patient like information on creating a medical advance directive? No - Patient declined Yes (MAU/Ambulatory/Procedural Areas - Information given) No - Patient declined No - Patient declined No - Patient declined No - Patient declined    Current Medications (verified) Outpatient Encounter Medications as of 08/04/2023  Medication Sig   carvedilol (COREG) 12.5 MG tablet Take 1 tablet (12.5 mg total) by mouth 2 (two) times daily with a meal.   carvedilol (COREG) 12.5 MG tablet Take 1 tablet (12.5 mg total) by mouth 2 (two) times daily with a meal.   cholecalciferol (VITAMIN D3) 25 MCG (1000 UNIT) tablet Take 1,000 Units by mouth daily.   NIFEdipine (PROCARDIA XL/NIFEDICAL-XL) 90 MG 24 hr tablet Take 1 tablet (90 mg total) by mouth daily.   UNABLE TO FIND Med Name: TDAP VACCINE   No facility-administered encounter medications on file as of 08/04/2023.    Allergies (verified) Patient has no known allergies.   History: Past Medical History:  Diagnosis Date   Allergy    Erectile dysfunction    Essential hypertension, benign    Past Surgical History:  Procedure Laterality Date   COLONOSCOPY     COLONOSCOPY N/A 01/02/2017   Procedure: COLONOSCOPY;  Surgeon: Malissa Hippo, MD;  Location: [facility];  Service: Endoscopy;  Laterality: N/A;  930   Family History  Problem Relation Age of Onset   Hypertension Mother    Cancer Father  bone   Diabetes Sister    Hypertension Sister    Hypertension Brother    Colon cancer Neg Hx    Social History   Socioeconomic History   Marital status: Single    Spouse name: Not on file   Number of children: 0   Years of education: 11   Highest education level: 11th grade  Occupational History   Occupation: retired   Tobacco Use   Smoking status: Former    Current packs/day: 0.50    Average packs/day: 0.5 packs/day for 35.0 years (17.5 ttl pk-yrs)    Types: Cigarettes   Smokeless tobacco: Never   Vaping Use   Vaping status: Never Used  Substance and Sexual Activity   Alcohol use: No   Drug use: No   Sexual activity: Not Currently  Other Topics Concern   Not on file  Social History Narrative   Lives with sister and a brother    Social Drivers of Corporate investment banker Strain: Low Risk  (08/04/2023)   Overall Financial Resource Strain (CARDIA)    Difficulty of Paying Living Expenses: Not hard at all  Food Insecurity: No Food Insecurity (08/04/2023)   Hunger Vital Sign    Worried About Running Out of Food in the Last Year: Never true    Ran Out of Food in the Last Year: Never true  Transportation Needs: No Transportation Needs (08/04/2023)   PRAPARE - Administrator, Civil Service (Medical): No    Lack of Transportation (Non-Medical): No  Physical Activity: Insufficiently Active (08/04/2023)   Exercise Vital Sign    Days of Exercise per Week: 2 days    Minutes of Exercise per Session: 50 min  Stress: No Stress Concern Present (08/04/2023)   [employer] of Occupational Health - Occupational Stress Questionnaire    Feeling of Stress : Not at all  Social Connections: Socially Isolated (08/04/2023)   Social Connection and Isolation Panel [NHANES]    Frequency of Communication with Friends and Family: Once a week    Frequency of Social Gatherings with Friends and Family: Once a week    Attends Religious Services: Never    Database administrator or Organizations: No    Attends Engineer, structural: Never    Marital Status: Never married    Tobacco Counseling Counseling given: Yes   Clinical Intake:  Pre-visit preparation completed: Yes  Pain : No/denies pain Pain Score: 0-No pain     BMI - recorded: 24.96 Nutritional Status: BMI of 19-24  Normal Nutritional Risks: None Diabetes: No  How often do you need to have someone help you when you read instructions, pamphlets, or other written materials from your doctor or pharmacy?: 1  - Never  Interpreter Needed?: No  Information entered by ::  Bill Ward, CMA   Activities of Daily Living    08/04/2023    1:15 PM  In your present state of health, do you have any difficulty performing the following activities:  Hearing? 0  Vision? 0  Difficulty concentrating or making decisions? 0  Walking or climbing stairs? 0  Dressing or bathing? 0  Doing errands, shopping? 0  Preparing Food and eating ? N  Using the Toilet? N  In the past six months, have you accidently leaked urine? N  Do you have problems with loss of bowel control? N  Managing your Medications? N  Managing your Finances? N  Housekeeping or managing your Housekeeping? N    Patient Care  Team: Kerri Perches, MD as PCP - General  Indicate any recent Medical Services you may have received from other than [facility] providers in the past year (date may be approximate).     Assessment:   This is a routine wellness examination for Bill Ward.  Hearing/Vision screen Hearing Screening - Comments:: Patient denies any hearing difficulties.   Vision Screening - Comments:: Wears rx glasses - up to date with routine eye exams  Dr. Carita Pian   Goals Addressed             This Visit's Progress    Patient Stated       Remain healthy and active       Depression Screen    08/04/2023    1:18 PM 06/18/2023   10:22 AM 02/14/2023   11:34 AM 12/24/2022   11:15 AM 07/31/2022    4:17 PM 07/31/2022    3:30 PM 07/24/2022    9:35 AM  PHQ 2/9 Scores  PHQ - 2 Score 0 0 0 0 0 0 0  PHQ- 9 Score 0 0   0 0     Fall Risk    08/04/2023    1:16 PM 06/18/2023   10:22 AM 02/14/2023   11:34 AM 12/24/2022   11:15 AM 07/31/2022    3:30 PM  Fall Risk   Falls in the past year? 0 0 0 0 0  Number falls in past yr: 0 0 0 0 0  Injury with Fall? 0 0 0 0 0  Risk for fall due to : No Fall Risks No Fall Risks No Fall Risks No Fall Risks No Fall Risks  Follow up Falls prevention discussed Falls evaluation completed  Falls evaluation completed Falls evaluation completed Falls evaluation completed    MEDICARE RISK AT HOME: Medicare Risk at Home Any stairs in or around the home?: No If so, are there any without handrails?: No Home free of loose throw rugs in walkways, pet beds, electrical cords, etc?: Yes Adequate lighting in your home to reduce risk of falls?: Yes Life alert?: No Use of a cane, walker or w/c?: No Grab bars in the bathroom?: No Shower chair or bench in shower?: No Elevated toilet seat or a handicapped toilet?: No  TIMED UP AND GO:  Was the test performed?  No    Cognitive Function:    07/25/2021   10:47 AM  MMSE - Mini Mental State Exam  Not completed: Unable to complete        08/04/2023    1:18 PM 07/25/2021   10:47 AM 07/20/2020   11:08 AM 07/09/2019    9:45 AM 07/07/2018    1:14 PM  6CIT Screen  What Year? 0 points 0 points 0 points 0 points 0 points  What month? 0 points 0 points 0 points 0 points 0 points  What time? 0 points 0 points 0 points 0 points 0 points  Count back from 20 0 points 0 points 4 points 0 points 4 points  Months in reverse 0 points 4 points 4 points 4 points 0 points  Repeat phrase 0 points 0 points 0 points 10 points 0 points  Total Score 0 points 4 points 8 points 14 points 4 points    Immunizations Immunization History  Administered Date(s) Administered   Fluad Quad(high Dose 65+) 03/24/2019, 04/18/2021, 05/06/2022   Fluad Trivalent(High Dose 65+) 06/18/2023   Influenza Split 07/09/2011, 05/07/2012   Influenza Whole 05/14/2005, 06/19/2009, 06/26/2010   Influenza,inj,Quad PF,6+ Mos  05/12/2013, 05/23/2014, 05/01/2015, 05/13/2016, 04/16/2017, 05/27/2018, 03/27/2020   Moderna SARS-COV2 Booster Vaccination 04/23/2021   Moderna Sars-Covid-2 Vaccination 10/09/2019, 11/06/2019, 07/05/2022   PNEUMOCOCCAL CONJUGATE-20 12/24/2022   Pneumococcal Conjugate-13 03/22/2014   Pneumococcal Polysaccharide-23 02/07/2009   Rsv, Bivalent, Protein  Subunit Rsvpref,pf Verdis Frederickson) 06/14/2022   Td 11/08/2004   Tdap 09/25/2021   Zoster Recombinant(Shingrix) 06/08/2021, 09/25/2021    TDAP status: Up to date  Flu Vaccine status: Up to date  Pneumococcal vaccine status: Up to date  Covid-19 vaccine status: Information provided on how to obtain vaccines.   Qualifies for Shingles Vaccine? No   Zostavax completed Yes   Shingrix Completed?: Yes  Screening Tests Health Maintenance  Topic Date Due   COVID-19 Vaccine (5 - 2024-25 season) 04/06/2023   Medicare Annual Wellness (AWV)  06/21/2024   DTaP/Tdap/Td (3 - Td or Tdap) 09/26/2031   Pneumonia Vaccine 53+ Years old  Completed   INFLUENZA VACCINE  Completed   Hepatitis C Screening  Completed   Zoster Vaccines- Shingrix  Completed   HPV VACCINES  Aged Out   Colonoscopy  Discontinued    Health Maintenance  Health Maintenance Due  Topic Date Due   COVID-19 Vaccine (5 - 2024-25 season) 04/06/2023    Colorectal cancer screening: No longer required.   Lung Cancer Screening: (Low Dose CT Chest recommended if Age 66-80 years, 20 pack-year currently smoking OR have quit w/in 15years.) does not qualify.   Lung Cancer Screening Referral: na  Additional Screening:  Hepatitis C Screening: does not qualify; Completed   Vision Screening: Recommended annual ophthalmology exams for early detection of glaucoma and other disorders of the eye. Is the patient up to date with their annual eye exam?  Yes  Who is the provider or what is the name of the office in which the patient attends annual eye exams? Bill Ward If pt is not established with a provider, would they like to be referred to a provider to establish care? No .   Dental Screening: Recommended annual dental exams for proper oral hygiene  Diabetic Foot Exam: na  Community Resource Referral / Chronic Care Management: CRR required this visit?  No   CCM required this visit?  No     Plan:     I have personally reviewed  and noted the following in the patient's chart:   Medical and social history Use of alcohol, tobacco or illicit drugs  Current medications and supplements including opioid prescriptions. Patient is not currently taking opioid prescriptions. Functional ability and status Nutritional status Physical activity Advanced directives List of other physicians Hospitalizations, surgeries, and ER visits in previous 12 months Vitals Screenings to include cognitive, depression, and falls Referrals and appointments  In addition, I have reviewed and discussed with patient certain preventive protocols, quality metrics, and best practice recommendations. A written personalized care plan for preventive services as well as general preventive health recommendations were provided to patient.     Jordan Hawks Renell Coaxum, CMA   08/04/2023   After Visit Summary: (Mail) Due to this being a telephonic visit, the after visit summary with patients personalized plan was offered to patient via mail   Nurse Notes: none

## 2023-08-04 NOTE — Patient Instructions (Signed)
Bill Ward , Thank you for taking time to come for your Medicare Wellness Visit. I appreciate your ongoing commitment to your health goals. Please review the following plan we discussed and let me know if I can assist you in the future.   Referrals/Orders/Follow-Ups/Clinician Recommendations:  Next Medicare Annual Wellness Visit: August 04, 2024 at 8:40 am video visit  This is a list of the screening recommended for you and due dates:  Health Maintenance  Topic Date Due   COVID-19 Vaccine (5 - 2024-25 season) 04/06/2023   Medicare Annual Wellness Visit  08/03/2024   DTaP/Tdap/Td vaccine (3 - Td or Tdap) 09/26/2031   Pneumonia Vaccine  Completed   Flu Shot  Completed   Hepatitis C Screening  Completed   Zoster (Shingles) Vaccine  Completed   HPV Vaccine  Aged Out   Colon Cancer Screening  Discontinued    Advanced directives: (Declined) Advance directive discussed with you today. Even though you declined this today, please call our office should you change your mind, and we can give you the proper paperwork for you to fill out.  Next Medicare Annual Wellness Visit scheduled for next year: Yes  Preventive Care 38 Years and Older, Male Preventive care refers to lifestyle choices and visits with your health care provider that can promote health and wellness. Preventive care visits are also called wellness exams. What can I expect for my preventive care visit? Counseling During your preventive care visit, your health care provider may ask about your: Medical history, including: Past medical problems. Family medical history. History of falls. Current health, including: Emotional well-being. Home life and relationship well-being. Sexual activity. Memory and ability to understand (cognition). Lifestyle, including: Alcohol, nicotine or tobacco, and drug use. Access to firearms. Diet, exercise, and sleep habits. Work and work Astronomer. Sunscreen use. Safety issues such as  seatbelt and bike helmet use. Physical exam Your health care provider will check your: Height and weight. These may be used to calculate your BMI (body mass index). BMI is a measurement that tells if you are at a healthy weight. Waist circumference. This measures the distance around your waistline. This measurement also tells if you are at a healthy weight and may help predict your risk of certain diseases, such as type 2 diabetes and high blood pressure. Heart rate and blood pressure. Body temperature. Skin for abnormal spots. What immunizations do I need?  Vaccines are usually given at various ages, according to a schedule. Your health care provider will recommend vaccines for you based on your age, medical history, and lifestyle or other factors, such as travel or where you work. What tests do I need? Screening Your health care provider may recommend screening tests for certain conditions. This may include: Lipid and cholesterol levels. Diabetes screening. This is done by checking your blood sugar (glucose) after you have not eaten for a while (fasting). Hepatitis C test. Hepatitis B test. HIV (human immunodeficiency virus) test. STI (sexually transmitted infection) testing, if you are at risk. Lung cancer screening. Colorectal cancer screening. Prostate cancer screening. Abdominal aortic aneurysm (AAA) screening. You may need this if you are a current or former smoker. Talk with your health care provider about your test results, treatment options, and if necessary, the need for more tests. Follow these instructions at home: Eating and drinking  Eat a diet that includes fresh fruits and vegetables, whole grains, lean protein, and low-fat dairy products. Limit your intake of foods with high amounts of sugar, saturated fats, and  salt. Take vitamin and mineral supplements as recommended by your health care provider. Do not drink alcohol if your health care provider tells you not to  drink. If you drink alcohol: Limit how much you have to 0-2 drinks a day. Know how much alcohol is in your drink. In the [place] one drink equals one 12 oz bottle of beer (355 mL), one 5 oz glass of wine (148 mL), or one 1 oz glass of hard liquor (44 mL). Lifestyle Brush your teeth every morning and night with fluoride toothpaste. Floss one time each day. Exercise for at least 30 minutes 5 or more days each week. Do not use any products that contain nicotine or tobacco. These products include cigarettes, chewing tobacco, and vaping devices, such as e-cigarettes. If you need help quitting, ask your health care provider. Do not use drugs. If you are sexually active, practice safe sex. Use a condom or other form of protection to prevent STIs. Take aspirin only as told by your health care provider. Make sure that you understand how much to take and what form to take. Work with your health care provider to find out whether it is safe and beneficial for you to take aspirin daily. Ask your health care provider if you need to take a cholesterol-lowering medicine (statin). Find healthy ways to manage stress, such as: Meditation, yoga, or listening to music. Journaling. Talking to a trusted person. Spending time with friends and family. Safety Always wear your seat belt while driving or riding in a vehicle. Do not drive: If you have been drinking alcohol. Do not ride with someone who has been drinking. When you are tired or distracted. While texting. If you have been using any mind-altering substances or drugs. Wear a helmet and other protective equipment during sports activities. If you have firearms in your house, make sure you follow all gun safety procedures. Minimize exposure to UV radiation to reduce your risk of skin cancer. What's next? Visit your health care provider once a year for an annual wellness visit. Ask your health care provider how often you should have your eyes and teeth  checked. Stay up to date on all vaccines. This information is not intended to replace advice given to you by your health care provider. Make sure you discuss any questions you have with your health care provider. Document Revised: 01/17/2021 Document Reviewed: 01/17/2021 Elsevier Patient Education  2024 [employer]. Understanding Your Risk for Falls Millions of people have serious injuries from falls each year. It is important to understand your risk of falling. Talk with your health care provider about your risk and what you can do to lower it. If you do have a serious fall, make sure to tell your provider. Falling once raises your risk of falling again. How can falls affect me? Serious injuries from falls are common. These include: Broken bones, such as hip fractures. Head injuries, such as traumatic brain injuries (TBI) or concussions. A fear of falling can cause you to avoid activities and stay at home. This can make your muscles weaker and raise your risk for a fall. What can increase my risk? There are a number of risk factors that increase your risk for falling. The more risk factors you have, the higher your risk of falling. Serious injuries from a fall happen most often to people who are older than 79 years old. Teenagers and young adults ages 45-29 are also at higher risk. Common risk factors include: Weakness in the lower  body. Being generally weak or confused due to long-term (chronic) illness. Dizziness or balance problems. Poor vision. Medicines that cause dizziness or drowsiness. These may include: Medicines for your blood pressure, heart, anxiety, insomnia, or swelling (edema). Pain medicines. Muscle relaxants. Other risk factors include: Drinking alcohol. Having had a fall in the past. Having foot pain or wearing improper footwear. Working at a dangerous job. Having any of the following in your home: Tripping hazards, such as floor clutter or loose rugs. Poor  lighting. Pets. Having dementia or memory loss. What actions can I take to lower my risk of falling?     Physical activity Stay physically fit. Do strength and balance exercises. Consider taking a regular class to build strength and balance. Yoga and tai chi are good options. Vision Have your eyes checked every year and your prescription for glasses or contacts updated as needed. Shoes and walking aids Wear non-skid shoes. Wear shoes that have rubber soles and low heels. Do not wear high heels. Do not walk around the house in socks or slippers. Use a cane or walker as told by your provider. Home safety Attach secure railings on both sides of your stairs. Install grab bars for your bathtub, shower, and toilet. Use a non-skid mat in your bathtub or shower. Attach bath mats securely with double-sided, non-slip rug tape. Use good lighting in all rooms. Keep a flashlight near your bed. Make sure there is a clear path from your bed to the bathroom. Use night-lights. Do not use throw rugs. Make sure all carpeting is taped or tacked down securely. Remove all clutter from walkways and stairways, including extension cords. Repair uneven or broken steps and floors. Avoid walking on icy or slippery surfaces. Walk on the grass instead of on icy or slick sidewalks. Use ice melter to get rid of ice on walkways in the winter. Use a cordless phone. Questions to ask your health care provider Can you help me check my risk for a fall? Do any of my medicines make me more likely to fall? Should I take a vitamin D supplement? What exercises can I do to improve my strength and balance? Should I make an appointment to have my vision checked? Do I need a bone density test to check for weak bones (osteoporosis)? Would it help to use a cane or a walker? Where to find more information Centers for Disease Control and Prevention, STEADI: TonerPromos.no Community-Based Fall Prevention Programs: TonerPromos.no [employer] on Aging: BaseRingTones.pl Contact a health care provider if: You fall at home. You are afraid of falling at home. You feel weak, drowsy, or dizzy. This information is not intended to replace advice given to you by your health care provider. Make sure you discuss any questions you have with your health care provider. Document Revised: 03/25/2022 Document Reviewed: 03/25/2022 Elsevier Patient Education  2024 [employer].

## 2023-09-14 ENCOUNTER — Other Ambulatory Visit: Payer: Self-pay | Admitting: Family Medicine

## 2023-10-15 ENCOUNTER — Other Ambulatory Visit: Payer: Self-pay

## 2023-10-15 ENCOUNTER — Telehealth: Payer: Self-pay | Admitting: Family Medicine

## 2023-10-15 MED ORDER — CARVEDILOL 12.5 MG PO TABS: 12.5000 mg | ORAL_TABLET | Freq: Two times a day (BID) | ORAL | 2 refills | Status: DC

## 2023-10-15 NOTE — Telephone Encounter (Signed)
 Prescription Request  10/15/2023  LOV: 06/18/2023  What is the name of the medication or equipment? carvedilol (COREG) 12.5 MG tablet [[id number]]   Have you contacted your pharmacy to request a refill? No   Which pharmacy would you like this sent to?  [facility]   Patient notified that their request is being sent to the clinical staff for review and that they should receive a response within 2 business days.   Please advise at Mobile [phone] (mobile)

## 2023-10-15 NOTE — Telephone Encounter (Signed)
 refilled

## 2023-11-18 ENCOUNTER — Ambulatory Visit: Payer: Medicare Other | Admitting: Family Medicine

## 2023-11-19 DIAGNOSIS — N183 Chronic kidney disease, stage 3 unspecified: Secondary | ICD-10-CM | POA: Diagnosis not present

## 2023-11-19 DIAGNOSIS — I10 Essential (primary) hypertension: Secondary | ICD-10-CM | POA: Diagnosis not present

## 2023-11-20 LAB — CBC
Hematocrit: 36.4 % — ABNORMAL LOW (ref 37.5–51.0)
Hemoglobin: 12.1 g/dL — ABNORMAL LOW (ref 13.0–17.7)
MCH: 31 pg (ref 26.6–33.0)
MCHC: 33.2 g/dL (ref 31.5–35.7)
MCV: 93 fL (ref 79–97)
Platelets: 167 10*3/uL (ref 150–450)
RBC: 3.9 x10E6/uL — ABNORMAL LOW (ref 4.14–5.80)
RDW: 11.7 % (ref 11.6–15.4)
WBC: 4.2 10*3/uL (ref 3.4–10.8)

## 2023-11-20 LAB — BMP8+EGFR
BUN/Creatinine Ratio: 17 (ref 10–24)
BUN: 23 mg/dL (ref 8–27)
CO2: 22 mmol/L (ref 20–29)
Calcium: 9.6 mg/dL (ref 8.6–10.2)
Chloride: 101 mmol/L (ref 96–106)
Creatinine, Ser: 1.32 mg/dL — ABNORMAL HIGH (ref 0.76–1.27)
Glucose: 99 mg/dL (ref 70–99)
Potassium: 5 mmol/L (ref 3.5–5.2)
Sodium: 136 mmol/L (ref 134–144)
eGFR: 55 mL/min/{1.73_m2} — ABNORMAL LOW (ref >59)

## 2023-12-10 ENCOUNTER — Ambulatory Visit (INDEPENDENT_AMBULATORY_CARE_PROVIDER_SITE_OTHER): Admitting: Family Medicine

## 2023-12-10 ENCOUNTER — Encounter: Payer: Self-pay | Admitting: Family Medicine

## 2023-12-10 VITALS — BP 130/78 | HR 78 | Resp 18 | Ht 69.0 in | Wt 172.0 lb

## 2023-12-10 DIAGNOSIS — Z13220 Encounter for screening for lipoid disorders: Secondary | ICD-10-CM | POA: Diagnosis not present

## 2023-12-10 DIAGNOSIS — I10 Essential (primary) hypertension: Secondary | ICD-10-CM

## 2023-12-10 DIAGNOSIS — Z0000 Encounter for general adult medical examination without abnormal findings: Secondary | ICD-10-CM

## 2023-12-10 DIAGNOSIS — E663 Overweight: Secondary | ICD-10-CM | POA: Diagnosis not present

## 2023-12-10 DIAGNOSIS — N1831 Chronic kidney disease, stage 3a: Secondary | ICD-10-CM | POA: Diagnosis not present

## 2023-12-10 DIAGNOSIS — Z125 Encounter for screening for malignant neoplasm of prostate: Secondary | ICD-10-CM

## 2023-12-10 NOTE — Patient Instructions (Addendum)
 Annual exam in  mid  November, call if you need me sooner, 1 pm appt plessd  Stay on same meds  Fasting CBC, lipid, cmp and EGFr, PSa , TSH 112/06 or after  It is important that you exercise regularly at least 30 minutes 5 times a week. If you develop chest pain, have severe difficulty breathing, or feel very tired, stop exercising immediately and seek medical attention   Thanks for choosing [facility], we consider it a privelige to serve you.

## 2023-12-15 ENCOUNTER — Encounter: Payer: Self-pay | Admitting: Family Medicine

## 2023-12-15 DIAGNOSIS — Z125 Encounter for screening for malignant neoplasm of prostate: Secondary | ICD-10-CM | POA: Insufficient documentation

## 2023-12-15 DIAGNOSIS — Z13220 Encounter for screening for lipoid disorders: Secondary | ICD-10-CM | POA: Insufficient documentation

## 2023-12-15 NOTE — Assessment & Plan Note (Signed)
  Patient re-educated about  the importance of commitment to a  minimum of 150 minutes of exercise per week as able.  The importance of healthy food choices with portion control discussed, as well as eating regularly and within a 12 hour window most days. The need to choose "clean , green" food 50 to 75% of the time is discussed, as well as to make water  the primary drink and set a goal of 64 ounces water  daily.       12/10/2023    2:42 PM 08/04/2023    1:13 PM 06/18/2023   10:20 AM  Weight /BMI  Weight 172 lb 0.6 oz 169 lb 170 lb 1.3 oz  Height 5\' 9"  (1.753 m) 5\' 9"  (1.753 m) 5\' 9"  (1.753 m)  BMI 25.41 kg/m2 24.96 kg/m2 25.12 kg/m2

## 2023-12-15 NOTE — Assessment & Plan Note (Signed)
Stable followed by Nephrology

## 2023-12-15 NOTE — Assessment & Plan Note (Signed)
 Hyperlipidemia:Low fat diet discussed and encouraged.   Lipid Panel  Lab Results  Component Value Date   CHOL 154 06/10/2023   HDL 50 06/10/2023   LDLCALC 89 06/10/2023   TRIG 77 06/10/2023   CHOLHDL 3.1 06/10/2023     Updated lab needed at/ before next visit.

## 2023-12-15 NOTE — Assessment & Plan Note (Signed)
 Controlled, no change in medication DASH diet and commitment to daily physical activity for a minimum of 30 minutes discussed and encouraged, as a part of hypertension management. The importance of attaining a healthy weight is also discussed.     12/10/2023    3:17 PM 12/10/2023    2:43 PM 12/10/2023    2:42 PM 08/04/2023    1:13 PM 06/18/2023   10:22 AM 06/18/2023   10:20 AM 02/14/2023   11:47 AM  BP/Weight  Systolic BP 130 132 154 -- 151 148 128  Diastolic BP 78 64 71 -- 68 67 72  Wt. (Lbs)   172.04 169  170.08   BMI   25.41 kg/m2 24.96 kg/m2  25.12 kg/m2

## 2023-12-15 NOTE — Assessment & Plan Note (Signed)
 Updated lab needed at/ before next visit.

## 2023-12-15 NOTE — Progress Notes (Signed)
 Bill Ward     MRN: [id number]      DOB: 08/20/1945  Chief Complaint  Patient presents with   Hypertension    5 month follow up     HPI Bill Ward is here for follow up and re-evaluation of chronic medical conditions, medication management and review of any available recent lab and radiology data.  Preventive health is updated, specifically  Cancer screening and Immunization.   Questions or concerns regarding consultations or procedures which the PT has had in the interim are  addressed. The PT denies any adverse reactions to current medications since the last visit.  There are no new concerns.  There are no specific complaints   ROS Denies recent fever or chills. Denies sinus pressure, nasal congestion, ear pain or sore throat. Denies chest congestion, productive cough or wheezing. Denies chest pains, palpitations and leg swelling Denies abdominal pain, nausea, vomiting,diarrhea or constipation.   Denies dysuria, frequency, hesitancy or incontinence. Denies joint pain, swelling and limitation in mobility. Denies headaches, seizures, numbness, or tingling. Denies depression, anxiety or insomnia. Denies skin break down or rash.   PE  BP 130/78   Pulse 78   Resp 18   Ht 5\' 9"  (1.753 m)   Wt 172 lb 0.6 oz (78 kg)   SpO2 98%   BMI 25.41 kg/m   Patient alert and oriented and in no cardiopulmonary distress.  HEENT: No facial asymmetry, EOMI,     Neck supple .  Chest: Clear to auscultation bilaterally.  CVS: S1, S2 no murmurs, no S3.Regular rate.  ABD: Soft non tender.   Ext: No edema  MS: Adequate ROM spine, shoulders, hips and knees.  Skin: Intact, no ulcerations or rash noted.  Psych: Good eye contact, normal affect. Memory intact not anxious or depressed appearing.  CNS: CN 2-12 intact, power,  normal throughout.no focal deficits noted.   Assessment & Plan  Essential hypertension Controlled, no change in medication DASH diet and commitment to  daily physical activity for a minimum of 30 minutes discussed and encouraged, as a part of hypertension management. The importance of attaining a healthy weight is also discussed.     12/10/2023    3:17 PM 12/10/2023    2:43 PM 12/10/2023    2:42 PM 08/04/2023    1:13 PM 06/18/2023   10:22 AM 06/18/2023   10:20 AM 02/14/2023   11:47 AM  BP/Weight  Systolic BP 130 132 154 -- 151 148 128  Diastolic BP 78 64 71 -- 68 67 72  Wt. (Lbs)   172.04 169  170.08   BMI   25.41 kg/m2 24.96 kg/m2  25.12 kg/m2        CKD (chronic kidney disease) stage 3, GFR 30-59 ml/min (HCC) Stable followed by Nephrology  Overweight (BMI 25.0-29.9)  Patient re-educated about  the importance of commitment to a  minimum of 150 minutes of exercise per week as able.  The importance of healthy food choices with portion control discussed, as well as eating regularly and within a 12 hour window most days. The need to choose "clean , green" food 50 to 75% of the time is discussed, as well as to make water  the primary drink and set a goal of 64 ounces water  daily.       12/10/2023    2:42 PM 08/04/2023    1:13 PM 06/18/2023   10:20 AM  Weight /BMI  Weight 172 lb 0.6 oz 169 lb 170 lb 1.3 oz  Height 5\' 9"  (1.753 m) 5\' 9"  (1.753 m) 5\' 9"  (1.753 m)  BMI 25.41 kg/m2 24.96 kg/m2 25.12 kg/m2      Lipid screening Hyperlipidemia:Low fat diet discussed and encouraged.   Lipid Panel  Lab Results  Component Value Date   CHOL 154 06/10/2023   HDL 50 06/10/2023   LDLCALC 89 06/10/2023   TRIG 77 06/10/2023   CHOLHDL 3.1 06/10/2023     Updated lab needed at/ before next visit.   Encounter for prostate cancer screening Updated lab needed at/ before next visit.

## 2024-01-05 DIAGNOSIS — D631 Anemia in chronic kidney disease: Secondary | ICD-10-CM | POA: Diagnosis not present

## 2024-01-05 DIAGNOSIS — R809 Proteinuria, unspecified: Secondary | ICD-10-CM | POA: Diagnosis not present

## 2024-01-05 DIAGNOSIS — N189 Chronic kidney disease, unspecified: Secondary | ICD-10-CM | POA: Diagnosis not present

## 2024-01-13 DIAGNOSIS — N1831 Chronic kidney disease, stage 3a: Secondary | ICD-10-CM | POA: Diagnosis not present

## 2024-01-13 DIAGNOSIS — D638 Anemia in other chronic diseases classified elsewhere: Secondary | ICD-10-CM | POA: Diagnosis not present

## 2024-01-13 DIAGNOSIS — I129 Hypertensive chronic kidney disease with stage 1 through stage 4 chronic kidney disease, or unspecified chronic kidney disease: Secondary | ICD-10-CM | POA: Diagnosis not present

## 2024-01-19 DIAGNOSIS — N189 Chronic kidney disease, unspecified: Secondary | ICD-10-CM | POA: Diagnosis not present

## 2024-02-09 ENCOUNTER — Other Ambulatory Visit: Payer: Self-pay | Admitting: Family Medicine

## 2024-02-09 MED ORDER — CARVEDILOL 12.5 MG PO TABS: 12.5000 mg | ORAL_TABLET | Freq: Two times a day (BID) | ORAL | 2 refills | Status: AC

## 2024-02-09 NOTE — Telephone Encounter (Signed)
 Copied from CRM [id number]. Topic: Clinical - Medication Refill >> Feb 09, 2024 12:25 PM DeAngela L wrote: Medication: carvedilol  (COREG ) 12.5 MG tablet   Has the patient contacted their pharmacy? No  (Agent: If no, request that the patient contact the pharmacy for the refill. If patient does not wish to contact the pharmacy document the reason why and proceed with request.) (Agent: If yes, when and what did the pharmacy advise?)  This is the patient's preferred pharmacy:  [facility] [address], [place] - [address] [place] [address] Phone: [phone] Fax: [phone]  Is this the correct pharmacy for this prescription? Yes  If no, delete pharmacy and type the correct one.   Has the prescription been filled recently? Yes   Is the patient out of the medication? No   Has the patient been seen for an appointment in the last year OR does the patient have an upcoming appointment? Yes   Can we respond through MyChart? No   Agent: Please be advised that Rx refills may take up to 3 business days. We ask that you follow-up with your pharmacy.

## 2024-03-31 DIAGNOSIS — D631 Anemia in chronic kidney disease: Secondary | ICD-10-CM | POA: Diagnosis not present

## 2024-03-31 DIAGNOSIS — E559 Vitamin D deficiency, unspecified: Secondary | ICD-10-CM | POA: Diagnosis not present

## 2024-03-31 DIAGNOSIS — I10 Essential (primary) hypertension: Secondary | ICD-10-CM | POA: Diagnosis not present

## 2024-03-31 DIAGNOSIS — N189 Chronic kidney disease, unspecified: Secondary | ICD-10-CM | POA: Diagnosis not present

## 2024-03-31 DIAGNOSIS — R809 Proteinuria, unspecified: Secondary | ICD-10-CM | POA: Diagnosis not present

## 2024-03-31 DIAGNOSIS — E211 Secondary hyperparathyroidism, not elsewhere classified: Secondary | ICD-10-CM | POA: Diagnosis not present

## 2024-04-07 DIAGNOSIS — N1831 Chronic kidney disease, stage 3a: Secondary | ICD-10-CM | POA: Diagnosis not present

## 2024-04-07 DIAGNOSIS — D638 Anemia in other chronic diseases classified elsewhere: Secondary | ICD-10-CM | POA: Diagnosis not present

## 2024-04-07 DIAGNOSIS — I129 Hypertensive chronic kidney disease with stage 1 through stage 4 chronic kidney disease, or unspecified chronic kidney disease: Secondary | ICD-10-CM | POA: Diagnosis not present

## 2024-06-05 LAB — LIPID PANEL
Chol/HDL Ratio: 3.1 ratio (ref 0.0–5.0)
Cholesterol, Total: 153 mg/dL (ref 100–199)
HDL: 50 mg/dL (ref >39)
LDL Chol Calc (NIH): 88 mg/dL (ref 0–99)
Triglycerides: 79 mg/dL (ref 0–149)
VLDL Cholesterol Cal: 15 mg/dL (ref 5–40)

## 2024-06-05 LAB — CBC WITH DIFFERENTIAL/PLATELET
Basophils Absolute: 0.1 x10E3/uL (ref 0.0–0.2)
Basos: 2 %
EOS (ABSOLUTE): 0.1 x10E3/uL (ref 0.0–0.4)
Eos: 3 %
Hematocrit: 38.1 % (ref 37.5–51.0)
Hemoglobin: 11.9 g/dL — ABNORMAL LOW (ref 13.0–17.7)
Immature Grans (Abs): 0 x10E3/uL (ref 0.0–0.1)
Immature Granulocytes: 0 %
Lymphocytes Absolute: 1.3 x10E3/uL (ref 0.7–3.1)
Lymphs: 31 %
MCH: 30 pg (ref 26.6–33.0)
MCHC: 31.2 g/dL — ABNORMAL LOW (ref 31.5–35.7)
MCV: 96 fL (ref 79–97)
Monocytes Absolute: 0.5 x10E3/uL (ref 0.1–0.9)
Monocytes: 11 %
Neutrophils Absolute: 2.2 x10E3/uL (ref 1.4–7.0)
Neutrophils: 53 %
Platelets: 172 x10E3/uL (ref 150–450)
RBC: 3.97 x10E6/uL — ABNORMAL LOW (ref 4.14–5.80)
RDW: 11.3 % — ABNORMAL LOW (ref 11.6–15.4)
WBC: 4.2 x10E3/uL (ref 3.4–10.8)

## 2024-06-05 LAB — CMP14+EGFR
ALT: 10 IU/L (ref 0–44)
AST: 17 IU/L (ref 0–40)
Albumin: 4.4 g/dL (ref 3.8–4.8)
Alkaline Phosphatase: 56 IU/L (ref 47–123)
BUN/Creatinine Ratio: 16 (ref 10–24)
BUN: 19 mg/dL (ref 8–27)
Bilirubin Total: 0.5 mg/dL (ref 0.0–1.2)
CO2: 24 mmol/L (ref 20–29)
Calcium: 9.6 mg/dL (ref 8.6–10.2)
Chloride: 100 mmol/L (ref 96–106)
Creatinine, Ser: 1.2 mg/dL (ref 0.76–1.27)
Globulin, Total: 2.8 g/dL (ref 1.5–4.5)
Glucose: 106 mg/dL — ABNORMAL HIGH (ref 70–99)
Potassium: 4.4 mmol/L (ref 3.5–5.2)
Sodium: 136 mmol/L (ref 134–144)
Total Protein: 7.2 g/dL (ref 6.0–8.5)
eGFR: 61 mL/min/1.73 (ref >59)

## 2024-06-05 LAB — TSH: TSH: 1.16 u[IU]/mL (ref 0.450–4.500)

## 2024-06-05 LAB — PSA: Prostate Specific Ag, Serum: 1.9 ng/mL (ref 0.0–4.0)

## 2024-06-07 ENCOUNTER — Ambulatory Visit: Payer: Self-pay | Admitting: Family Medicine

## 2024-06-18 ENCOUNTER — Encounter: Payer: Self-pay | Admitting: Family Medicine

## 2024-06-18 ENCOUNTER — Ambulatory Visit: Admitting: Family Medicine

## 2024-06-18 VITALS — BP 158/80 | HR 72 | Resp 16 | Ht 69.0 in | Wt 174.1 lb

## 2024-06-18 DIAGNOSIS — Z0001 Encounter for general adult medical examination with abnormal findings: Secondary | ICD-10-CM | POA: Diagnosis not present

## 2024-06-18 DIAGNOSIS — I10 Essential (primary) hypertension: Secondary | ICD-10-CM

## 2024-06-18 MED ORDER — SPIRONOLACTONE 25 MG PO TABS: 25.0000 mg | ORAL_TABLET | Freq: Every day | ORAL | 3 refills | Status: AC

## 2024-06-18 NOTE — Progress Notes (Signed)
   Bill Ward     MRN: [id number]      DOB: [birth year]  Chief Complaint  Patient presents with   Annual Exam    Cpe     HPI: Patient is in for annual physical exam. Uncontrolled hypertension is  addressed at the visit. Recent labs, if available are reviewed.and are excellent Immunization is reviewed , and  updated if needed.    PE; BP (!) 158/80   Pulse 72   Resp 16   Ht 5' 9 (1.753 m)   Wt 174 lb 1.9 oz (79 kg)   SpO2 97%   BMI 25.71 kg/m   Pleasant male, alert and oriented x 3, in no cardio-pulmonary distress. Afebrile. HEENT No facial trauma or asymetry. Sinuses non tender. EOMI External ears normal,  Neck: supple, no adenopathy,JVD or thyromegaly.No bruits.  Chest: Clear to ascultation bilaterally.No crackles or wheezes. Non tender to palpation  Cardiovascular system; Heart sounds normal,  S1 and  S2 ,no S3.  No murmur, or thrill. Apical beat not displaced Peripheral pulses normal.  Abdomen: Soft, non tender, no organomegaly or masses. No bruits. Bowel sounds normal. No guarding, tenderness or rebound.    Musculoskeletal exam: Full ROM of spine, hips , shoulders and knees. No deformity ,swelling or crepitus noted. No muscle wasting or atrophy.   Neurologic: Cranial nerves 2 to 12 intact. Power, tone ,sensation and reflexes normal throughout. No disturbance in gait. No tremor.  Skin: Intact, no ulceration, erythema , scaling or rash noted. Pigmentation normal throughout  Psych; Normal mood and affect. Judgement and concentration normal   Assessment & Plan:  Encounter for Medicare annual examination with abnormal findings Annual exam as documented. Counseling done  re healthy lifestyle involving commitment to 150 minutes exercise per week, heart healthy diet, and attaining healthy weight.The importance of adequate sleep also discussed. Regular seat belt use and home safety, is also discussed. Changes in health habits are decided on by  the patient with goals and time frames  set for achieving them. Immunization and cancer screening needs are specifically addressed at this visit.   Essential hypertension Uncontrolled add spironolactone 25 mg daily DASH diet and commitment to daily physical activity for a minimum of 30 minutes discussed and encouraged, as a part of hypertension management. The importance of attaining a healthy weight is also discussed.     06/18/2024   10:46 AM 06/18/2024   10:08 AM 12/10/2023    3:17 PM 12/10/2023    2:43 PM 12/10/2023    2:42 PM 08/04/2023    1:13 PM 06/18/2023   10:22 AM  BP/Weight  Systolic BP 158 157 130 132 154 -- 151  Diastolic BP 80 75 78 64 71 -- 68  Wt. (Lbs)  174.12   172.04 169   BMI  25.71 kg/m2   25.41 kg/m2 24.96 kg/m2

## 2024-06-18 NOTE — Patient Instructions (Addendum)
 F/u in  8 to 10 weeks re evaluate blood pressure  New additional medication for blood pressure spironolactone 25 mg once daily  Continue nifedipine and carvedilol  as before for blood pressure  Excellent labs.  Please  get covid vaccine at your pharmacy  Non  fasting bmp and Egfr  1 week before next appointment  Thanks for choosing [facility], we consider it a privelige to serve you.

## 2024-06-20 NOTE — Assessment & Plan Note (Signed)
 Uncontrolled add spironolactone 25 mg daily DASH diet and commitment to daily physical activity for a minimum of 30 minutes discussed and encouraged, as a part of hypertension management. The importance of attaining a healthy weight is also discussed.     06/18/2024   10:46 AM 06/18/2024   10:08 AM 12/10/2023    3:17 PM 12/10/2023    2:43 PM 12/10/2023    2:42 PM 08/04/2023    1:13 PM 06/18/2023   10:22 AM  BP/Weight  Systolic BP 158 157 130 132 154 -- 151  Diastolic BP 80 75 78 64 71 -- 68  Wt. (Lbs)  174.12   172.04 169   BMI  25.71 kg/m2   25.41 kg/m2 24.96 kg/m2

## 2024-06-20 NOTE — Assessment & Plan Note (Signed)

## 2024-08-04 ENCOUNTER — Ambulatory Visit: Payer: Medicare Other

## 2024-08-04 VITALS — Ht 69.0 in | Wt 163.9 lb

## 2024-08-04 DIAGNOSIS — Z0000 Encounter for general adult medical examination without abnormal findings: Secondary | ICD-10-CM | POA: Diagnosis not present

## 2024-08-04 DIAGNOSIS — I10 Essential (primary) hypertension: Secondary | ICD-10-CM

## 2024-08-04 DIAGNOSIS — N1831 Chronic kidney disease, stage 3a: Secondary | ICD-10-CM

## 2024-08-04 NOTE — Progress Notes (Signed)
 "  No voiced or noted concerns at this time. Chief Complaint  Patient presents with   Medicare Wellness     Subjective:   Bill Ward is a 78 y.o. male who presents for a Medicare Annual Wellness Visit.  Visit info / Clinical Intake: Medicare Wellness Visit Type:: Subsequent Annual Wellness Visit Persons participating in visit and providing information:: patient Medicare Wellness Visit Mode:: Telephone If telephone:: video error Since this visit was completed virtually, some vitals may be partially provided or unavailable. Missing vitals are due to the limitations of the virtual format.: Documented vitals are patient reported If Telephone or Video please confirm:: I connected with patient using audio/video enable telemedicine. I verified patient identity with two identifiers, discussed telehealth limitations, and patient agreed to proceed. Patient Location:: home Provider Location:: office Interpreter Needed?: No Pre-visit prep was completed: yes AWV questionnaire completed by patient prior to visit?: no Living arrangements:: (!) lives alone Patient's Overall Health Status Rating: very good Typical amount of pain: none Does pain affect daily life?: no Are you currently prescribed opioids?: no  Dietary Habits and Nutritional Risks How many meals a day?: 3 Eats fruit and vegetables daily?: yes Most meals are obtained by: preparing own meals In the last 2 weeks, have you had any of the following?: none Diabetic:: no  Functional Status Activities of Daily Living (to include ambulation/medication): Independent Ambulation: Independent Medication Administration: Independent Home Management (perform basic housework or laundry): Independent Manage your own finances?: yes Primary transportation is: family / friends Concerns about vision?: no *vision screening is required for WTM* Concerns about hearing?: no  Fall Screening Falls in the past year?: 0 Number of falls in past  year: 0 Was there an injury with Fall?: 0 Fall Risk Category Calculator: 0 Patient Fall Risk Level: Low Fall Risk  Fall Risk Patient at Risk for Falls Due to: No Fall Risks Fall risk Follow up: Falls evaluation completed; Education provided; Falls prevention discussed  Home and Transportation Safety: All rugs have non-skid backing?: N/A, no rugs All stairs or steps have railings?: yes Grab bars in the bathtub or shower?: (!) no Have non-skid surface in bathtub or shower?: yes Good home lighting?: yes Regular seat belt use?: yes Hospital stays in the last year:: no  Cognitive Assessment Difficulty concentrating, remembering, or making decisions? : no Will 6CIT or Mini Cog be Completed: yes What year is it?: 0 points What month is it?: 0 points Give patient an address phrase to remember (5 components): [address] [place] About what time is it?: 0 points Count backwards from 20 to 1: 0 points Say the months of the year in reverse: 0 points Repeat the address phrase from earlier: 0 points 6 CIT Score: 0 points  Advance Directives (For Healthcare) Does Patient Have a Medical Advance Directive?: No Would patient like information on creating a medical advance directive?: No - Patient declined  Reviewed/Updated  Reviewed/Updated: Reviewed All (Medical, Surgical, Family, Medications, Allergies, Care Teams, Patient Goals)    Allergies (verified) Patient has no known allergies.   Current Medications (verified) Outpatient Encounter Medications as of 08/04/2024  Medication Sig   carvedilol  (COREG ) 12.5 MG tablet Take 1 tablet (12.5 mg total) by mouth 2 (two) times daily with a meal.   cholecalciferol (VITAMIN D3) 25 MCG (1000 UNIT) tablet Take 1,000 Units by mouth daily.   NIFEdipine (PROCARDIA XL/NIFEDICAL-XL) 90 MG 24 hr tablet Take 1 tablet (90 mg total) by mouth daily.   spironolactone  (ALDACTONE ) 25  MG tablet Take 1 tablet (25 mg total) by mouth daily.   UNABLE TO FIND  Med Name: TDAP VACCINE   No facility-administered encounter medications on file as of 08/04/2024.    History: Past Medical History:  Diagnosis Date   Allergy    Erectile dysfunction    Essential hypertension, benign    Past Surgical History:  Procedure Laterality Date   COLONOSCOPY     COLONOSCOPY N/A 01/02/2017   Procedure: COLONOSCOPY;  Surgeon: Golda Claudis PENNER, MD;  Location: [facility];  Service: Endoscopy;  Laterality: N/A;  930   Family History  Problem Relation Age of Onset   Hypertension Mother    Cancer Father        bone   Diabetes Sister    Hypertension Sister    Hypertension Brother    Colon cancer Neg Hx    Social History   Occupational History   Occupation: retired   Tobacco Use   Smoking status: Former    Current packs/day: 0.50    Average packs/day: 0.5 packs/day for 35.0 years (17.5 ttl pk-yrs)    Types: Cigarettes   Smokeless tobacco: Never  Vaping Use   Vaping status: Never Used  Substance and Sexual Activity   Alcohol use: No   Drug use: No   Sexual activity: Not Currently   Tobacco Counseling Counseling given: Yes  SDOH Screenings   Food Insecurity: No Food Insecurity (08/04/2024)  Housing: Low Risk (08/04/2024)  Transportation Needs: No Transportation Needs (08/04/2024)  Utilities: Not At Risk (08/04/2024)  Alcohol Screen: Low Risk (08/04/2023)  Depression (PHQ2-9): Low Risk (08/04/2024)  Financial Resource Strain: Low Risk (08/04/2023)  Physical Activity: Patient Declined (08/04/2024)  Social Connections: Socially Isolated (08/04/2024)  Stress: No Stress Concern Present (08/04/2024)  Tobacco Use: Medium Risk (08/04/2024)  Health Literacy: Adequate Health Literacy (08/04/2024)   See flowsheets for full screening details  Depression Screen PHQ 2 & 9 Depression Scale- Over the past 2 weeks, how often have you been bothered by any of the following problems? Little interest or pleasure in doing things: 0 Feeling down, depressed,  or hopeless (PHQ Adolescent also includes...irritable): 0 PHQ-2 Total Score: 0 Trouble falling or staying asleep, or sleeping too much: 0 Feeling tired or having little energy: 0 Poor appetite or overeating (PHQ Adolescent also includes...weight loss): 0 Feeling bad about yourself - or that you are a failure or have let yourself or your family down: 0 Trouble concentrating on things, such as reading the newspaper or watching television (PHQ Adolescent also includes...like school work): 0 Moving or speaking so slowly that other people could have noticed. Or the opposite - being so fidgety or restless that you have been moving around a lot more than usual: 0 Thoughts that you would be better off dead, or of hurting yourself in some way: 0 PHQ-9 Total Score: 0  Depression Treatment Depression Interventions/Treatment : EYV7-0 Score <4 Follow-up Not Indicated     Goals Addressed             This Visit's Progress    Patient Stated   On track    Remain healthy and active             Objective:    Today's Vitals   08/04/24 0819  Weight: 163 lb 14.4 oz (74.3 kg)  Height: 5' 9 (1.753 m)   Body mass index is 24.2 kg/m.  Hearing/Vision screen Hearing Screening - Comments:: Patient denies any hearing difficulties.   Vision Screening -  Comments:: Wears rx glasses - up to date with routine eye exams with  Oneil Kawasaki Immunizations and Health Maintenance Health Maintenance  Topic Date Due   COVID-19 Vaccine (5 - 2025-26 season) 04/05/2024   Medicare Annual Wellness (AWV)  08/03/2024   DTaP/Tdap/Td (3 - Td or Tdap) 09/26/2031   Pneumococcal Vaccine: 50+ Years  Completed   Influenza Vaccine  Completed   Zoster Vaccines- Shingrix  Completed   Meningococcal B Vaccine  Aged Out   Colonoscopy  Discontinued   Hepatitis C Screening  Discontinued        Assessment/Plan:  This is a routine wellness examination for Pate.  Patient Care Team: Antonetta Rollene BRAVO, MD as PCP -  General Kawasaki Oneil, DO (Optometry) Rachele Gaynell RAMAN, MD as Consulting Physician (Nephrology)  I have personally reviewed and noted the following in the patients chart:   Medical and social history Use of alcohol, tobacco or illicit drugs  Current medications and supplements including opioid prescriptions. Functional ability and status Nutritional status Physical activity Advanced directives List of other physicians Hospitalizations, surgeries, and ER visits in previous 12 months Vitals Screenings to include cognitive, depression, and falls Referrals and appointments  Orders Placed This Encounter  Procedures   BMP8+EGFR   In addition, I have reviewed and discussed with patient certain preventive protocols, quality metrics, and best practice recommendations. A written personalized care plan for preventive services as well as general preventive health recommendations were provided to patient.   Loomis Anacker, CMA   08/04/2024   Return for Yearly Medicare Visit on Monday August 08, 2025 at 3:10 pm IN THE OFFICE.  After Visit Summary: (Mail) Due to this being a telephonic visit, the after visit summary with patients personalized plan was offered to patient via mail    "

## 2024-08-04 NOTE — Patient Instructions (Signed)
 Mr. Compere,  Thank you for taking the time for your Medicare Wellness Visit. I appreciate your continued commitment to your health goals. Please review the care plan we discussed, and feel free to reach out if I can assist you further.  Please note that Annual Wellness Visits do not include a physical exam. Some assessments may be limited, especially if the visit was conducted virtually. If needed, we may recommend an in-person follow-up with your provider.  Ongoing Care Seeing your primary care provider every 3 to 6 months helps us  monitor your health and provide consistent, personalized care.   1 year follow up for Medicare well visit: Monday August 08, 2025 at 3:10 pm with medicare wellness nurse in office  Recommended Screenings:  Health Maintenance  Topic Date Due   COVID-19 Vaccine (5 - 2025-26 season) 04/05/2024   Medicare Annual Wellness Visit  08/03/2024   DTaP/Tdap/Td vaccine (3 - Td or Tdap) 09/26/2031   Pneumococcal Vaccine for age over 23  Completed   Flu Shot  Completed   Zoster (Shingles) Vaccine  Completed   Meningitis B Vaccine  Aged Out   Colon Cancer Screening  Discontinued   Hepatitis C Screening  Discontinued       08/04/2024   12:25 PM  Advanced Directives  Does Patient Have a Medical Advance Directive? No  Would patient like information on creating a medical advance directive? No - Patient declined    Vision: Annual vision screenings are recommended for early detection of glaucoma, cataracts, and diabetic retinopathy. These exams can also reveal signs of chronic conditions such as diabetes and high blood pressure.  Dental: Annual dental screenings help detect early signs of oral cancer, gum disease, and other conditions linked to overall health, including heart disease and diabetes.  Please see the attached documents for additional preventive care recommendations.

## 2024-08-07 ENCOUNTER — Ambulatory Visit: Payer: Self-pay | Admitting: Family Medicine

## 2024-08-07 LAB — BMP8+EGFR
BUN/Creatinine Ratio: 17 (ref 10–24)
BUN: 22 mg/dL (ref 8–27)
CO2: 22 mmol/L (ref 20–29)
Calcium: 9.1 mg/dL (ref 8.6–10.2)
Chloride: 102 mmol/L (ref 96–106)
Creatinine, Ser: 1.32 mg/dL — ABNORMAL HIGH (ref 0.76–1.27)
Glucose: 116 mg/dL — ABNORMAL HIGH (ref 70–99)
Potassium: 4.7 mmol/L (ref 3.5–5.2)
Sodium: 137 mmol/L (ref 134–144)
eGFR: 55 mL/min/1.73 — ABNORMAL LOW

## 2024-08-13 ENCOUNTER — Ambulatory Visit

## 2024-08-13 DIAGNOSIS — I10 Essential (primary) hypertension: Secondary | ICD-10-CM

## 2024-08-13 NOTE — Progress Notes (Signed)
 Patient is in office today for a nurse visit for Blood Pressure Check. Patient blood pressure was 134/72, Patient No chest pain, No shortness of breath, No dyspnea on exertion

## 2025-08-08 ENCOUNTER — Ambulatory Visit: Payer: Self-pay
